# Patient Record
Sex: Female | Born: 1940 | Race: White | Hispanic: No | Marital: Married | Smoking: Never smoker
Health system: Southern US, Community
[De-identification: ages and names within clinical notes are randomized; demographics above are authoritative.]

## PROBLEM LIST (undated history)

## (undated) DIAGNOSIS — I8390 Asymptomatic varicose veins of unspecified lower extremity: Secondary | ICD-10-CM

## (undated) DIAGNOSIS — C44310 Basal cell carcinoma of skin of unspecified parts of face: Secondary | ICD-10-CM

## (undated) DIAGNOSIS — J329 Chronic sinusitis, unspecified: Secondary | ICD-10-CM

## (undated) DIAGNOSIS — F329 Major depressive disorder, single episode, unspecified: Secondary | ICD-10-CM

## (undated) DIAGNOSIS — F32A Depression, unspecified: Secondary | ICD-10-CM

## (undated) DIAGNOSIS — C44321 Squamous cell carcinoma of skin of nose: Secondary | ICD-10-CM

## (undated) DIAGNOSIS — E7800 Pure hypercholesterolemia, unspecified: Secondary | ICD-10-CM

## (undated) DIAGNOSIS — M810 Age-related osteoporosis without current pathological fracture: Secondary | ICD-10-CM

## (undated) HISTORY — PX: OTHER SURGICAL HISTORY: SHX169

## (undated) HISTORY — PX: COLONOSCOPY: SHX174

---

## 1998-08-19 ENCOUNTER — Other Ambulatory Visit: Admission: RE | Admit: 1998-08-19 | Discharge: 1998-08-19 | Payer: Self-pay | Admitting: Obstetrics & Gynecology

## 1999-08-20 ENCOUNTER — Encounter: Payer: Self-pay | Admitting: Internal Medicine

## 1999-08-20 ENCOUNTER — Encounter: Admission: RE | Admit: 1999-08-20 | Discharge: 1999-08-20 | Payer: Self-pay | Admitting: Internal Medicine

## 1999-09-02 ENCOUNTER — Other Ambulatory Visit: Admission: RE | Admit: 1999-09-02 | Discharge: 1999-09-02 | Payer: Self-pay | Admitting: Obstetrics and Gynecology

## 2000-09-13 ENCOUNTER — Encounter: Payer: Self-pay | Admitting: Family Medicine

## 2000-09-13 ENCOUNTER — Encounter: Admission: RE | Admit: 2000-09-13 | Discharge: 2000-09-13 | Payer: Self-pay | Admitting: Family Medicine

## 2000-12-20 ENCOUNTER — Other Ambulatory Visit: Admission: RE | Admit: 2000-12-20 | Discharge: 2000-12-20 | Payer: Self-pay | Admitting: Obstetrics and Gynecology

## 2001-10-26 ENCOUNTER — Encounter: Admission: RE | Admit: 2001-10-26 | Discharge: 2001-10-26 | Payer: Self-pay | Admitting: Family Medicine

## 2001-10-26 ENCOUNTER — Encounter: Payer: Self-pay | Admitting: Family Medicine

## 2002-04-04 ENCOUNTER — Other Ambulatory Visit: Admission: RE | Admit: 2002-04-04 | Discharge: 2002-04-04 | Payer: Self-pay | Admitting: Obstetrics and Gynecology

## 2003-01-02 ENCOUNTER — Encounter: Payer: Self-pay | Admitting: Family Medicine

## 2003-01-02 ENCOUNTER — Encounter: Admission: RE | Admit: 2003-01-02 | Discharge: 2003-01-02 | Payer: Self-pay | Admitting: Family Medicine

## 2003-10-17 ENCOUNTER — Other Ambulatory Visit: Admission: RE | Admit: 2003-10-17 | Discharge: 2003-10-17 | Payer: Self-pay | Admitting: Family Medicine

## 2003-11-08 ENCOUNTER — Encounter: Admission: RE | Admit: 2003-11-08 | Discharge: 2003-11-08 | Payer: Self-pay | Admitting: Family Medicine

## 2004-01-24 ENCOUNTER — Ambulatory Visit (HOSPITAL_COMMUNITY): Admission: RE | Admit: 2004-01-24 | Discharge: 2004-01-24 | Payer: Self-pay | Admitting: Gastroenterology

## 2004-01-24 ENCOUNTER — Encounter (INDEPENDENT_AMBULATORY_CARE_PROVIDER_SITE_OTHER): Payer: Self-pay | Admitting: *Deleted

## 2005-02-24 ENCOUNTER — Other Ambulatory Visit: Admission: RE | Admit: 2005-02-24 | Discharge: 2005-02-24 | Payer: Self-pay | Admitting: Family Medicine

## 2006-02-03 ENCOUNTER — Encounter: Admission: RE | Admit: 2006-02-03 | Discharge: 2006-02-03 | Payer: Self-pay | Admitting: Family Medicine

## 2007-02-11 ENCOUNTER — Encounter: Admission: RE | Admit: 2007-02-11 | Discharge: 2007-02-11 | Payer: Self-pay | Admitting: Family Medicine

## 2007-05-11 ENCOUNTER — Other Ambulatory Visit: Admission: RE | Admit: 2007-05-11 | Discharge: 2007-05-11 | Payer: Self-pay | Admitting: Family Medicine

## 2008-02-27 ENCOUNTER — Encounter: Admission: RE | Admit: 2008-02-27 | Discharge: 2008-02-27 | Payer: Self-pay | Admitting: Family Medicine

## 2008-06-15 DIAGNOSIS — C44321 Squamous cell carcinoma of skin of nose: Secondary | ICD-10-CM

## 2008-06-15 HISTORY — DX: Squamous cell carcinoma of skin of nose: C44.321

## 2009-11-29 ENCOUNTER — Other Ambulatory Visit: Admission: RE | Admit: 2009-11-29 | Discharge: 2009-11-29 | Payer: Self-pay | Admitting: Family Medicine

## 2010-01-24 ENCOUNTER — Encounter: Admission: RE | Admit: 2010-01-24 | Discharge: 2010-01-24 | Payer: Self-pay | Admitting: Family Medicine

## 2010-07-06 ENCOUNTER — Encounter: Payer: Self-pay | Admitting: Family Medicine

## 2010-10-31 NOTE — Op Note (Signed)
NAME:  Amanda Hanna, Amanda Hanna                      ACCOUNT NO.:  [id number]   MEDICAL RECORD NO.:  [id number]                   PATIENT TYPE:  AMB   LOCATION:  ENDO                                 FACILITY:  [facility]   PHYSICIAN:  Graylin Shiver, M.D.                DATE OF BIRTH:  09-02-1940   DATE OF PROCEDURE:  01/24/2004  DATE OF DISCHARGE:                                 OPERATIVE REPORT   PROCEDURE:  Colonoscopy with biopsy.   INDICATIONS FOR PROCEDURE:  Screening.   Informed consent was obtained after explanation of the risks of bleeding,  infection and perforation.   PREMEDICATION:  Fentanyl 75 mcg IV, Versed 7 mg IV.   DESCRIPTION OF PROCEDURE:  With the patient in the left lateral decubitus  position, a rectal exam was performed and no masses were felt. The Olympus  colonoscope was then inserted into the rectum and advanced around the colon  to the cecum. Cecal landmarks were identified. The cecum and ascending colon  were normal. The transverse colon was normal.  The descending colon and  sigmoid revealed diverticulosis.  In the sigmoid, there was a small 3 mm  polyp biopsied off with cold forceps. The rectum was normal. She tolerated  the procedure well without complications.   IMPRESSION:  1. Small sigmoid polyp, diagnosis code 211.3.  2. Diverticulosis of the left colon.   PLAN:  The pathology will be checked.                                               Graylin Shiver, M.D.    SFG/MEDQ  D:  01/24/2004  T:  01/24/2004  Job:  [id number]   cc:   Stacie Acres. White, M.D.  [address]  [place]  [place] [address]  Fax: [phone]

## 2011-03-09 ENCOUNTER — Other Ambulatory Visit: Payer: Self-pay | Admitting: Family Medicine

## 2011-03-09 DIAGNOSIS — Z1231 Encounter for screening mammogram for malignant neoplasm of breast: Secondary | ICD-10-CM

## 2011-03-20 ENCOUNTER — Ambulatory Visit: Payer: Self-pay

## 2011-03-25 ENCOUNTER — Ambulatory Visit
Admission: RE | Admit: 2011-03-25 | Discharge: 2011-03-25 | Disposition: A | Discharge status: Home / Self Care | Payer: Medicare Other | Source: Ambulatory Visit | Attending: Family Medicine | Admitting: Family Medicine

## 2011-03-25 DIAGNOSIS — Z1231 Encounter for screening mammogram for malignant neoplasm of breast: Secondary | ICD-10-CM

## 2012-03-10 ENCOUNTER — Other Ambulatory Visit: Payer: Self-pay | Admitting: Family Medicine

## 2012-03-10 DIAGNOSIS — Z1231 Encounter for screening mammogram for malignant neoplasm of breast: Secondary | ICD-10-CM

## 2012-03-25 ENCOUNTER — Ambulatory Visit
Admission: RE | Admit: 2012-03-25 | Discharge: 2012-03-25 | Disposition: A | Discharge status: Home / Self Care | Payer: Medicare Other | Source: Ambulatory Visit | Attending: Family Medicine | Admitting: Family Medicine

## 2012-03-25 DIAGNOSIS — Z1231 Encounter for screening mammogram for malignant neoplasm of breast: Secondary | ICD-10-CM

## 2013-04-04 ENCOUNTER — Other Ambulatory Visit: Payer: Self-pay

## 2013-04-04 DIAGNOSIS — Z1231 Encounter for screening mammogram for malignant neoplasm of breast: Secondary | ICD-10-CM

## 2013-05-03 ENCOUNTER — Ambulatory Visit
Admission: RE | Admit: 2013-05-03 | Discharge: 2013-05-03 | Disposition: A | Discharge status: Home / Self Care | Payer: Medicare Other | Source: Ambulatory Visit

## 2013-05-03 DIAGNOSIS — Z1231 Encounter for screening mammogram for malignant neoplasm of breast: Secondary | ICD-10-CM

## 2014-03-07 ENCOUNTER — Other Ambulatory Visit: Payer: Self-pay | Admitting: Gastroenterology

## 2014-04-12 ENCOUNTER — Other Ambulatory Visit: Payer: Self-pay

## 2014-04-12 DIAGNOSIS — Z1231 Encounter for screening mammogram for malignant neoplasm of breast: Secondary | ICD-10-CM

## 2014-05-04 ENCOUNTER — Ambulatory Visit
Admission: RE | Admit: 2014-05-04 | Discharge: 2014-05-04 | Disposition: A | Discharge status: Home / Self Care | Payer: Medicare Other | Source: Ambulatory Visit

## 2014-05-04 DIAGNOSIS — Z1231 Encounter for screening mammogram for malignant neoplasm of breast: Secondary | ICD-10-CM

## 2014-06-29 ENCOUNTER — Other Ambulatory Visit: Payer: Self-pay | Admitting: Family Medicine

## 2014-07-23 ENCOUNTER — Encounter (HOSPITAL_COMMUNITY): Payer: Self-pay

## 2014-07-23 ENCOUNTER — Ambulatory Visit (HOSPITAL_COMMUNITY)
Admission: RE | Admit: 2014-07-23 | Discharge: 2014-07-23 | Disposition: A | Discharge status: Home / Self Care | Payer: Medicare Other | Source: Ambulatory Visit | Attending: Family Medicine | Admitting: Family Medicine

## 2014-07-23 ENCOUNTER — Other Ambulatory Visit (HOSPITAL_COMMUNITY): Payer: Self-pay | Admitting: Family Medicine

## 2014-07-23 DIAGNOSIS — M810 Age-related osteoporosis without current pathological fracture: Secondary | ICD-10-CM | POA: Insufficient documentation

## 2014-07-23 HISTORY — DX: Squamous cell carcinoma of skin of nose: C44.321

## 2014-07-23 HISTORY — DX: Chronic sinusitis, unspecified: J32.9

## 2014-07-23 HISTORY — DX: Asymptomatic varicose veins of unspecified lower extremity: I83.90

## 2014-07-23 HISTORY — DX: Major depressive disorder, single episode, unspecified: F32.9

## 2014-07-23 HISTORY — DX: Basal cell carcinoma of skin of unspecified parts of face: C44.310

## 2014-07-23 HISTORY — DX: Depression, unspecified: F32.A

## 2014-07-23 HISTORY — DX: Pure hypercholesterolemia, unspecified: E78.00

## 2014-07-23 MED ORDER — ZOLEDRONIC ACID 5 MG/100ML IV SOLN
5.0000 mg | Freq: Once | INTRAVENOUS | Status: AC
  Administered 2014-07-23: 5 mg via INTRAVENOUS
  Filled 2014-07-23: qty 100

## 2014-07-23 MED ORDER — SODIUM CHLORIDE 0.9 % IV SOLN
Freq: Once | INTRAVENOUS | Status: AC
  Administered 2014-07-23: 250 mL via INTRAVENOUS

## 2014-07-23 NOTE — Discharge Instructions (Signed)
Drink  Fluids/water as tolerated over the next 72 hours Tylenol or ibuprofen OTC as directed Continue Calcium and Vit D as directed by your MD Contact Dr Dema Severin for any questions or concerns   RECLAST Zoledronic Acid injection (Paget's Disease, Osteoporosis) What is this medicine? ZOLEDRONIC ACID (ZOE le dron ik AS id) lowers the amount of calcium loss from bone. It is used to treat Paget's disease and osteoporosis in women. This medicine may be used for other purposes; ask your health care provider or pharmacist if you have questions. COMMON BRAND NAME(S): Reclast, Zometa What should I tell my health care provider before I take this medicine? They need to know if you have any of these conditions: -aspirin-sensitive asthma -cancer, especially if you are receiving medicines used to treat cancer -dental disease or wear dentures -infection -kidney disease -low levels of calcium in the blood -past surgery on the parathyroid gland or intestines -receiving corticosteroids like dexamethasone or prednisone -an unusual or allergic reaction to zoledronic acid, other medicines, foods, dyes, or preservatives -pregnant or trying to get pregnant -breast-feeding How should I use this medicine? This medicine is for infusion into a vein. It is given by a health care professional in a hospital or clinic setting. Talk to your pediatrician regarding the use of this medicine in children. This medicine is not approved for use in children. Overdosage: If you think you have taken too much of this medicine contact a poison control center or emergency room at once. NOTE: This medicine is only for you. Do not share this medicine with others. What if I miss a dose? It is important not to miss your dose. Call your doctor or health care professional if you are unable to keep an appointment. What may interact with this medicine? -certain antibiotics given by injection -NSAIDs, medicines for pain and inflammation,  like ibuprofen or naproxen -some diuretics like bumetanide, furosemide -teriparatide This list may not describe all possible interactions. Give your health care provider a list of all the medicines, herbs, non-prescription drugs, or dietary supplements you use. Also tell them if you smoke, drink alcohol, or use illegal drugs. Some items may interact with your medicine. What should I watch for while using this medicine? Visit your doctor or health care professional for regular checkups. It may be some time before you see the benefit from this medicine. Do not stop taking your medicine unless your doctor tells you to. Your doctor may order blood tests or other tests to see how you are doing. Women should inform their doctor if they wish to become pregnant or think they might be pregnant. There is a potential for serious side effects to an unborn child. Talk to your health care professional or pharmacist for more information. You should make sure that you get enough calcium and vitamin D while you are taking this medicine. Discuss the foods you eat and the vitamins you take with your health care professional. Some people who take this medicine have severe bone, joint, and/or muscle pain. This medicine may also increase your risk for jaw problems or a broken thigh bone. Tell your doctor right away if you have severe pain in your jaw, bones, joints, or muscles. Tell your doctor if you have any pain that does not go away or that gets worse. Tell your dentist and dental surgeon that you are taking this medicine. You should not have major dental surgery while on this medicine. See your dentist to have a dental exam and fix  any dental problems before starting this medicine. Take good care of your teeth while on this medicine. Make sure you see your dentist for regular follow-up appointments. What side effects may I notice from receiving this medicine? Side effects that you should report to your doctor or health  care professional as soon as possible: -allergic reactions like skin rash, itching or hives, swelling of the face, lips, or tongue -anxiety, confusion, or depression -breathing problems -changes in vision -eye pain -feeling faint or lightheaded, falls -jaw pain, especially after dental work -mouth sores -muscle cramps, stiffness, or weakness -trouble passing urine or change in the amount of urine Side effects that usually do not require medical attention (report to your doctor or health care professional if they continue or are bothersome): -bone, joint, or muscle pain -constipation -diarrhea -fever -hair loss -irritation at site where injected -loss of appetite -nausea, vomiting -stomach upset -trouble sleeping -trouble swallowing -weak or tired This list may not describe all possible side effects. Call your doctor for medical advice about side effects. You may report side effects to FDA at 1-800-FDA-1088. Where should I keep my medicine? This drug is given in a hospital or clinic and will not be stored at home. NOTE: This sheet is a summary. It may not cover all possible information. If you have questions about this medicine, talk to your doctor, pharmacist, or health care provider.  2015, Elsevier/Gold Standard. (2012-11-14 10:03:48) Osteoporosis Throughout your life, your body breaks down old bone and replaces it with new bone. As you get older, your body does not replace bone as quickly as it breaks it down. By the age of 4 years, most people begin to gradually lose bone because of the imbalance between bone loss and replacement. Some people lose more bone than others. Bone loss beyond a specified normal degree is considered osteoporosis.  Osteoporosis affects the strength and durability of your bones. The inside of the ends of your bones and your flat bones, like the bones of your pelvis, look like honeycomb, filled with tiny open spaces. As bone loss occurs, your bones become  less dense. This means that the open spaces inside your bones become bigger and the walls between these spaces become thinner. This makes your bones weaker. Bones of a person with osteoporosis can become so weak that they can break (fracture) during minor accidents, such as a simple fall. CAUSES  The following factors have been associated with the development of osteoporosis:  Smoking.  Drinking more than 2 alcoholic drinks several days per week.  Long-term use of certain medicines:  Corticosteroids.  Chemotherapy medicines.  Thyroid medicines.  Antiepileptic medicines.  Gonadal hormone suppression medicine.  Immunosuppression medicine.  Being underweight.  Lack of physical activity.  Lack of exposure to the sun. This can lead to vitamin D deficiency.  Certain medical conditions:  Certain inflammatory bowel diseases, such as Crohn disease and ulcerative colitis.  Diabetes.  Hyperthyroidism.  Hyperparathyroidism. RISK FACTORS Anyone can develop osteoporosis. However, the following factors can increase your risk of developing osteoporosis:  Gender--Women are at higher risk than men.  Age--Being older than 50 years increases your risk.  Ethnicity--White and Asian people have an increased risk.  Weight --Being extremely underweight can increase your risk of osteoporosis.  Family history of osteoporosis--Having a family member who has developed osteoporosis can increase your risk. SYMPTOMS  Usually, people with osteoporosis have no symptoms.  DIAGNOSIS  Signs during a physical exam that may prompt your caregiver to suspect osteoporosis  include:  Decreased height. This is usually caused by the compression of the bones that form your spine (vertebrae) because they have weakened and become fractured.  A curving or rounding of the upper back (kyphosis). To confirm signs of osteoporosis, your caregiver may request a procedure that uses 2 low-dose X-ray beams with  different levels of energy to measure your bone mineral density (dual-energy X-ray absorptiometry [DXA]). Also, your caregiver may check your level of vitamin D. TREATMENT  The goal of osteoporosis treatment is to strengthen bones in order to decrease the risk of bone fractures. There are different types of medicines available to help achieve this goal. Some of these medicines work by slowing the processes of bone loss. Some medicines work by increasing bone density. Treatment also involves making sure that your levels of calcium and vitamin D are adequate. PREVENTION  There are things you can do to help prevent osteoporosis. Adequate intake of calcium and vitamin D can help you achieve optimal bone mineral density. Regular exercise can also help, especially resistance and weight-bearing activities. If you smoke, quitting smoking is an important part of osteoporosis prevention. MAKE SURE YOU:  Understand these instructions.  Will watch your condition.  Will get help right away if you are not doing well or get worse. FOR MORE INFORMATION www.osteo.org and EquipmentWeekly.com.ee Document Released: 03/11/2005 Document Revised: 09/26/2012 Document Reviewed: 05/16/2011 [facility] Patient Information 2015 [place], [place]. This information is not intended to replace advice given to you by your health care provider. Make sure you discuss any questions you have with your health care provider.

## 2014-10-18 ENCOUNTER — Other Ambulatory Visit (HOSPITAL_COMMUNITY): Payer: Self-pay | Admitting: Dermatology

## 2015-04-01 ENCOUNTER — Other Ambulatory Visit: Payer: Self-pay

## 2015-04-01 DIAGNOSIS — Z1231 Encounter for screening mammogram for malignant neoplasm of breast: Secondary | ICD-10-CM

## 2015-04-09 ENCOUNTER — Emergency Department (HOSPITAL_COMMUNITY)
Admission: EM | Admit: 2015-04-09 | Discharge: 2015-04-09 | Disposition: A | Discharge status: Home / Self Care | Payer: Medicare Other | Attending: Emergency Medicine | Admitting: Emergency Medicine

## 2015-04-09 ENCOUNTER — Encounter (HOSPITAL_COMMUNITY): Payer: Self-pay | Admitting: Emergency Medicine

## 2015-04-09 DIAGNOSIS — F329 Major depressive disorder, single episode, unspecified: Secondary | ICD-10-CM | POA: Diagnosis not present

## 2015-04-09 DIAGNOSIS — S6991XA Unspecified injury of right wrist, hand and finger(s), initial encounter: Secondary | ICD-10-CM | POA: Diagnosis present

## 2015-04-09 DIAGNOSIS — S61319A Laceration without foreign body of unspecified finger with damage to nail, initial encounter: Secondary | ICD-10-CM

## 2015-04-09 DIAGNOSIS — E7800 Pure hypercholesterolemia, unspecified: Secondary | ICD-10-CM | POA: Diagnosis not present

## 2015-04-09 DIAGNOSIS — Y9389 Activity, other specified: Secondary | ICD-10-CM | POA: Diagnosis not present

## 2015-04-09 DIAGNOSIS — Z79899 Other long term (current) drug therapy: Secondary | ICD-10-CM | POA: Diagnosis not present

## 2015-04-09 DIAGNOSIS — Z7951 Long term (current) use of inhaled steroids: Secondary | ICD-10-CM | POA: Insufficient documentation

## 2015-04-09 DIAGNOSIS — Z85828 Personal history of other malignant neoplasm of skin: Secondary | ICD-10-CM | POA: Diagnosis not present

## 2015-04-09 DIAGNOSIS — Z8679 Personal history of other diseases of the circulatory system: Secondary | ICD-10-CM | POA: Diagnosis not present

## 2015-04-09 DIAGNOSIS — W260XXA Contact with knife, initial encounter: Secondary | ICD-10-CM | POA: Diagnosis not present

## 2015-04-09 DIAGNOSIS — Z8709 Personal history of other diseases of the respiratory system: Secondary | ICD-10-CM | POA: Insufficient documentation

## 2015-04-09 DIAGNOSIS — Y9289 Other specified places as the place of occurrence of the external cause: Secondary | ICD-10-CM | POA: Insufficient documentation

## 2015-04-09 DIAGNOSIS — Y998 Other external cause status: Secondary | ICD-10-CM | POA: Diagnosis not present

## 2015-04-09 DIAGNOSIS — S61312A Laceration without foreign body of right middle finger with damage to nail, initial encounter: Secondary | ICD-10-CM | POA: Insufficient documentation

## 2015-04-09 NOTE — Discharge Instructions (Signed)
1. Medications: usual home medications 2. Treatment: rest, drink plenty of fluids, change dressing daily, keep dressing clean, dry, and intact. 3. Follow Up: please followup with your primary doctor in for discussion of your diagnoses and further evaluation after today's visit; please return to the ER for signs of infection (heat, redness, warmth, discharge)   Laceration Care, Adult A laceration is a cut that goes through all layers of the skin. The cut also goes into the tissue that is right under the skin. Some cuts heal on their own. Others need to be closed with stitches (sutures), staples, skin adhesive strips, or wound glue. Taking care of your cut lowers your risk of infection and helps your cut to heal better. HOW TO TAKE CARE OF YOUR CUT For stitches or staples:  Keep the wound clean and dry.  If you were given a bandage (dressing), you should change it at least one time per day or as told by your doctor. You should also change it if it gets wet or dirty.  Keep the wound completely dry for the first 24 hours or as told by your doctor. After that time, you may take a shower or a bath. However, make sure that the wound is not soaked in water until after the stitches or staples have been removed.  Clean the wound one time each day or as told by your doctor:  Wash the wound with soap and water.  Rinse the wound with water until all of the soap comes off.  Pat the wound dry with a clean towel. Do not rub the wound.  After you clean the wound, put a thin layer of antibiotic ointment on it as told by your doctor. This ointment:  Helps to prevent infection.  Keeps the bandage from sticking to the wound.  Have your stitches or staples removed as told by your doctor. If your doctor used skin adhesive strips:   Keep the wound clean and dry.  If you were given a bandage, you should change it at least one time per day or as told by your doctor. You should also change it if it gets  dirty or wet.  Do not get the skin adhesive strips wet. You can take a shower or a bath, but be careful to keep the wound dry.  If the wound gets wet, pat it dry with a clean towel. Do not rub the wound.  Skin adhesive strips fall off on their own. You can trim the strips as the wound heals. Do not remove any strips that are still stuck to the wound. They will fall off after a while. If your doctor used wound glue:  Try to keep your wound dry, but you may briefly wet it in the shower or bath. Do not soak the wound in water, such as by swimming.  After you take a shower or a bath, gently pat the wound dry with a clean towel. Do not rub the wound.  Do not do any activities that will make you really sweaty until the skin glue has fallen off on its own.  Do not apply liquid, cream, or ointment medicine to your wound while the skin glue is still on.  If you were given a bandage, you should change it at least one time per day or as told by your doctor. You should also change it if it gets dirty or wet.  If a bandage is placed over the wound, do not let the tape for  the bandage touch the skin glue.  Do not pick at the glue. The skin glue usually stays on for 5-10 days. Then, it falls off of the skin. General Instructions  To help prevent scarring, make sure to cover your wound with sunscreen whenever you are outside after stitches are removed, after adhesive strips are removed, or when wound glue stays in place and the wound is healed. Make sure to wear a sunscreen of at least 30 SPF.  Take over-the-counter and prescription medicines only as told by your doctor.  If you were given antibiotic medicine or ointment, take or apply it as told by your doctor. Do not stop using the antibiotic even if your wound is getting better.  Do not scratch or pick at the wound.  Keep all follow-up visits as told by your doctor. This is important.  Check your wound every day for signs of infection. Watch  for:  Redness, swelling, or pain.  Fluid, blood, or pus.  Raise (elevate) the injured area above the level of your heart while you are sitting or lying down, if possible. GET HELP IF:  You got a tetanus shot and you have any of these problems at the injection site:  Swelling.  Very bad pain.  Redness.  Bleeding.  You have a fever.  A wound that was closed breaks open.  You notice a bad smell coming from your wound or your bandage.  You notice something coming out of the wound, such as wood or glass.  Medicine does not help your pain.  You have more redness, swelling, or pain at the site of your wound.  You have fluid, blood, or pus coming from your wound.  You notice a change in the color of your skin near your wound.  You need to change the bandage often because fluid, blood, or pus is coming from the wound.  You start to have a new rash.  You start to have numbness around the wound. GET HELP RIGHT AWAY IF:  You have very bad swelling around the wound.  Your pain suddenly gets worse and is very bad.  You notice painful lumps near the wound or on skin that is anywhere on your body.  You have a red streak going away from your wound.  The wound is on your hand or foot and you cannot move a finger or toe like you usually can.  The wound is on your hand or foot and you notice that your fingers or toes look pale or bluish.   This information is not intended to replace advice given to you by your health care provider. Make sure you discuss any questions you have with your health care provider.   Document Released: 11/18/2007 Document Revised: 10/16/2014 Document Reviewed: 05/28/2014 Elsevier Interactive Patient Education [employer].

## 2015-04-09 NOTE — ED Notes (Signed)
Pt was making potato soup and cut her finger with potato slicer.

## 2015-04-09 NOTE — ED Provider Notes (Signed)
CSN: [id number]     Arrival date & time 04/09/15  1244 History   First MD Initiated Contact with Patient 04/09/15 1249     Chief Complaint  Patient presents with  . Extremity Laceration    right third finger     HPI   Amanda Hanna is a 74 y.o. female with a PMH of HLD, depression who presents to the ED with right third fingernail laceration. She states she was peeling potatoes and accidentally cut her fingernail. She reports she was able to control the bleeding by applying pressure. She states she has mild pain over the site of her laceration. She denies weakness, numbness, paresthesia. She states her tetanus has been updated within the past 5 years.   Past Medical History  Diagnosis Date  . Depression   . Sinusitis   . Facial basal cell cancer   . 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 Varicose vein   . Hypercholesteremia    Past Surgical History  Procedure Laterality Date  . Removal squamous cell cancer nose    . Vaginal delivery    . Deviated septum  repair    . Eyelid surgery      bilateral 2008  . Basal cell carcinoma from neck    . Colonoscopy    . Osteoporosis     No family history on file. Social History  Substance Use Topics  . Smoking status: Never Smoker   . Smokeless tobacco: None  . Alcohol Use: No   OB History    No data available      Review of Systems  Skin: Positive for wound.  Neurological: Negative for weakness and numbness.      Allergies  Boniva; Septra; and Sulfa antibiotics  Home Medications   Prior to Admission medications   Medication Sig Start Date End Date Taking? Authorizing Provider  atorvastatin (LIPITOR) 20 MG tablet Take 20 mg by mouth daily.    Historical Provider, MD  calcium carbonate (TUMS - DOSED IN MG ELEMENTAL CALCIUM) 500 MG chewable tablet Chew 1 tablet by mouth daily.    Historical Provider, MD  escitalopram (LEXAPRO) 10 MG tablet Take 10 mg by mouth daily.    Historical Provider, MD  fluticasone  (FLONASE) 50 MCG/ACT nasal spray Place into both nostrils daily.    Historical Provider, MD  triamterene-hydrochlorothiazide (DYAZIDE) 37.5-25 MG per capsule Take 1 capsule by mouth daily.    Historical Provider, MD    BP 143/86 mmHg  Pulse 93  Temp(Src) 98.2 F (36.8 C) (Oral)  Resp 20  Ht 5\' 1"  (1.549 m)  Wt 192 lb (87.091 kg)  BMI 36.30 kg/m2  SpO2 99% Physical Exam  Constitutional: She is oriented to person, place, and time. She appears well-developed and well-nourished. No distress.  HENT:  Head: Normocephalic and atraumatic.  Right Ear: External ear normal.  Left Ear: External ear normal.  Nose: Nose normal.  Mouth/Throat: Oropharynx is clear and moist.  Eyes: Conjunctivae and EOM are normal. Pupils are equal, round, and reactive to light. Right eye exhibits no discharge. Left eye exhibits no discharge. No scleral icterus.  Neck: Normal range of motion. Neck supple.  Cardiovascular: Normal rate and regular rhythm.   Pulmonary/Chest: Effort normal and breath sounds normal. No respiratory distress.  Musculoskeletal: Normal range of motion. She exhibits tenderness. She exhibits no edema.  Mild tenderness to palpation over laceration.  Neurological: She is alert and oriented to person, place, and time. She has normal  strength. No sensory deficit.  Skin: Skin is warm and dry. She is not diaphoretic.  Small superficial laceration to nail of right third finger.  Psychiatric: She has a normal mood and affect. Her behavior is normal.  Nursing note and vitals reviewed.   ED Course  Procedures (including critical care time)  Labs Review Labs Reviewed - No data to display  Imaging Review No results found.     EKG Interpretation None      MDM   Final diagnoses:  Laceration of fingernail, initial encounter    74 year old female presents with right third fingernail laceration, which occurred approximately 30 minutes prior to arrival. She reports mild pain. She denies  numbness, weakness, paresthesia. She states her tetanus is up-to-date. Patient is afebrile. Vital signs stable. Small superficial laceration to nail of right third finger with mild tenderness to palpation, hemostatic. Full range of motion of right hand. Strength and sensation intact. Distal pulses intact.   No repairable laceration. Wound cleaned and dressed in the ED. Patient is stable for discharge at this time. Return precautions discussed including signs and symptoms of infection. Patient to follow up with PCP as needed. Patient verbalizes her understanding and is in agreement with plan.  BP 143/86 mmHg  Pulse 93  Temp(Src) 98.2 F (36.8 C) (Oral)  Resp 20  Ht 5\' 1"  (1.549 m)  Wt 192 lb (87.091 kg)  BMI 36.30 kg/m2  SpO2 99%     Marella Chimes, PA-C 04/09/15 1535  Virgel Manifold, MD 04/09/15 1550

## 2015-04-09 NOTE — ED Notes (Signed)
Patient was cutting potatoes with a kitchen knife earlier today and cut the tip of her right third finger.  Patient has avulsion lac to right third finger with distal tip of nail missing.  Bleeding controlled.  Full ROM - flexion and extension, radial nerve intact.

## 2015-05-06 ENCOUNTER — Ambulatory Visit
Admission: RE | Admit: 2015-05-06 | Discharge: 2015-05-06 | Disposition: A | Discharge status: Home / Self Care | Payer: Medicare Other | Source: Ambulatory Visit

## 2015-05-06 DIAGNOSIS — Z1231 Encounter for screening mammogram for malignant neoplasm of breast: Secondary | ICD-10-CM

## 2015-07-09 ENCOUNTER — Other Ambulatory Visit: Payer: Self-pay | Admitting: Family Medicine

## 2015-07-29 ENCOUNTER — Encounter (HOSPITAL_COMMUNITY): Payer: Self-pay

## 2015-07-29 ENCOUNTER — Other Ambulatory Visit (HOSPITAL_COMMUNITY): Payer: Self-pay | Admitting: Family Medicine

## 2015-07-29 ENCOUNTER — Ambulatory Visit (HOSPITAL_COMMUNITY)
Admission: RE | Admit: 2015-07-29 | Discharge: 2015-07-29 | Disposition: A | Discharge status: Home / Self Care | Payer: Medicare Other | Source: Ambulatory Visit | Attending: Family Medicine | Admitting: Family Medicine

## 2015-07-29 DIAGNOSIS — M810 Age-related osteoporosis without current pathological fracture: Secondary | ICD-10-CM | POA: Insufficient documentation

## 2015-07-29 HISTORY — DX: Age-related osteoporosis without current pathological fracture: M81.0

## 2015-07-29 MED ORDER — SODIUM CHLORIDE 0.9 % IV SOLN
Freq: Once | INTRAVENOUS | Status: AC
  Administered 2015-07-29: 12:00:00 via INTRAVENOUS

## 2015-07-29 MED ORDER — ZOLEDRONIC ACID 5 MG/100ML IV SOLN
5.0000 mg | Freq: Once | INTRAVENOUS | Status: AC
  Administered 2015-07-29: 5 mg via INTRAVENOUS
  Filled 2015-07-29: qty 100

## 2015-07-29 NOTE — Discharge Instructions (Signed)
Drink  Fluids/water as tolerated over the next 72 hours °Tylenol or ibuprofen OTC as directed °Continue Calcium and Vit D as directed by your MD ° ° °RECLAST °Zoledronic Acid injection (Paget's Disease, Osteoporosis) °What is this medicine? °ZOLEDRONIC ACID (ZOE le dron ik AS id) lowers the amount of calcium loss from bone. It is used to treat Paget's disease and osteoporosis in women. °This medicine may be used for other purposes; ask your health care provider or pharmacist if you have questions. °What should I tell my health care provider before I take this medicine? °They need to know if you have any of these conditions: °-aspirin-sensitive asthma °-cancer, especially if you are receiving medicines used to treat cancer °-dental disease or wear dentures °-infection °-kidney disease °-low levels of calcium in the blood °-past surgery on the parathyroid gland or intestines °-receiving corticosteroids like dexamethasone or prednisone °-an unusual or allergic reaction to zoledronic acid, other medicines, foods, dyes, or preservatives °-pregnant or trying to get pregnant °-breast-feeding °How should I use this medicine? °This medicine is for infusion into a vein. It is given by a health care professional in a hospital or clinic setting. °Talk to your pediatrician regarding the use of this medicine in children. This medicine is not approved for use in children. °Overdosage: If you think you have taken too much of this medicine contact a poison control center or emergency room at once. °NOTE: This medicine is only for you. Do not share this medicine with others. °What if I miss a dose? °It is important not to miss your dose. Call your doctor or health care professional if you are unable to keep an appointment. °What may interact with this medicine? °-certain antibiotics given by injection °-NSAIDs, medicines for pain and inflammation, like ibuprofen or naproxen °-some diuretics like bumetanide,  furosemide °-teriparatide °This list may not describe all possible interactions. Give your health care provider a list of all the medicines, herbs, non-prescription drugs, or dietary supplements you use. Also tell them if you smoke, drink alcohol, or use illegal drugs. Some items may interact with your medicine. °What should I watch for while using this medicine? °Visit your doctor or health care professional for regular checkups. It may be some time before you see the benefit from this medicine. Do not stop taking your medicine unless your doctor tells you to. Your doctor may order blood tests or other tests to see how you are doing. °Women should inform their doctor if they wish to become pregnant or think they might be pregnant. There is a potential for serious side effects to an unborn child. Talk to your health care professional or pharmacist for more information. °You should make sure that you get enough calcium and vitamin D while you are taking this medicine. Discuss the foods you eat and the vitamins you take with your health care professional. °Some people who take this medicine have severe bone, joint, and/or muscle pain. This medicine may also increase your risk for jaw problems or a broken thigh bone. Tell your doctor right away if you have severe pain in your jaw, bones, joints, or muscles. Tell your doctor if you have any pain that does not go away or that gets worse. °Tell your dentist and dental surgeon that you are taking this medicine. You should not have major dental surgery while on this medicine. See your dentist to have a dental exam and fix any dental problems before starting this medicine. Take good care of your teeth   while on this medicine. Make sure you see your dentist for regular follow-up appointments. °What side effects may I notice from receiving this medicine? °Side effects that you should report to your doctor or health care professional as soon as possible: °-allergic reactions  like skin rash, itching or hives, swelling of the face, lips, or tongue °-anxiety, confusion, or depression °-breathing problems °-changes in vision °-eye pain °-feeling faint or lightheaded, falls °-jaw pain, especially after dental work °-mouth sores °-muscle cramps, stiffness, or weakness °-redness, blistering, peeling or loosening of the skin, including inside the mouth °-trouble passing urine or change in the amount of urine °Side effects that usually do not require medical attention (report to your doctor or health care professional if they continue or are bothersome): °-bone, joint, or muscle pain °-constipation °-diarrhea °-fever °-hair loss °-irritation at site where injected °-loss of appetite °-nausea, vomiting °-stomach upset °-trouble sleeping °-trouble swallowing °-weak or tired °This list may not describe all possible side effects. Call your doctor for medical advice about side effects. You may report side effects to FDA at 1-800-FDA-1088. °Where should I keep my medicine? °This drug is given in a hospital or clinic and will not be stored at home. °NOTE: This sheet is a summary. It may not cover all possible information. If you have questions about this medicine, talk to your doctor, pharmacist, or health care provider. °  °© 2016, [employer]. (2013-10-28 14:19:57) °Osteoporosis °Osteoporosis is the thinning and loss of density in the bones. Osteoporosis makes the bones more brittle, fragile, and likely to break (fracture). Over time, osteoporosis can cause the bones to become so weak that they fracture after a simple fall. The bones most likely to fracture are the bones in the hip, wrist, and spine. °CAUSES  °The exact cause is not known. °RISK FACTORS °Anyone can develop osteoporosis. You may be at greater risk if you have a family history of the condition or have poor nutrition. You may also have a higher risk if you are:  °· Female.   °· 50 years old or older. °· A smoker. °· Not  physically active.   °· White or Asian. °· Slender. °SIGNS AND SYMPTOMS  °A fracture might be the first sign of the disease, especially if it results from a fall or injury that would not usually cause a bone to break. Other signs and symptoms include:  °· Low back and neck pain. °· Stooped posture. °· Height loss. °DIAGNOSIS  °To make a diagnosis, your health care provider may: °· Take a medical history. °· Perform a physical exam. °· Order tests, such as: °¨ A bone mineral density test. °¨ A dual-energy X-ray absorptiometry test. °TREATMENT  °The goal of osteoporosis treatment is to strengthen your bones to reduce your risk of a fracture. Treatment may involve: °· Making lifestyle changes, such as: °¨ Eating a diet rich in calcium. °¨ Doing weight-bearing and muscle-strengthening exercises. °¨ Stopping tobacco use. °¨ Limiting alcohol intake. °· Taking medicine to slow the process of bone loss or to increase bone density. °· Monitoring your levels of calcium and vitamin D. °HOME CARE INSTRUCTIONS °· Include calcium and vitamin D in your diet. Calcium is important for bone health, and vitamin D helps the body absorb calcium. °· Perform weight-bearing and muscle-strengthening exercises as directed by your health care provider. °· Do not use any tobacco products, including cigarettes, chewing tobacco, and electronic cigarettes. If you need help quitting, ask your health care provider. °· Limit your alcohol intake. °·   Take medicines only as directed by your health care provider. °· Keep all follow-up visits as directed by your health care provider. This is important. °· Take precautions at home to lower your risk of falling, such as: °¨ Keeping rooms well lit and clutter free. °¨ Installing safety rails on stairs. °¨ Using rubber mats in the bathroom and other areas that are often wet or slippery. °SEEK IMMEDIATE MEDICAL CARE IF:  °You fall or injure yourself.  °  °This information is not intended to replace advice  given to you by your health care provider. Make sure you discuss any questions you have with your health care provider. °  °Document Released: 03/11/2005 Document Revised: 06/22/2014 Document Reviewed: 11/09/2013 °Elsevier Interactive Patient Education ©2016 [employer]. ° ° °

## 2016-06-12 ENCOUNTER — Encounter (HOSPITAL_COMMUNITY): Payer: Self-pay | Admitting: Emergency Medicine

## 2016-06-12 ENCOUNTER — Ambulatory Visit (HOSPITAL_COMMUNITY)
Admission: EM | Admit: 2016-06-12 | Discharge: 2016-06-12 | Disposition: A | Discharge status: Home / Self Care | Payer: Medicare Other | Attending: Family Medicine | Admitting: Family Medicine

## 2016-06-12 DIAGNOSIS — R05 Cough: Secondary | ICD-10-CM | POA: Diagnosis not present

## 2016-06-12 DIAGNOSIS — J40 Bronchitis, not specified as acute or chronic: Secondary | ICD-10-CM

## 2016-06-12 DIAGNOSIS — R059 Cough, unspecified: Secondary | ICD-10-CM

## 2016-06-12 MED ORDER — BENZONATATE 100 MG PO CAPS: 200.0000 mg | ORAL_CAPSULE | Freq: Three times a day (TID) | ORAL | 0 refills | Status: DC | PRN

## 2016-06-12 MED ORDER — PREDNISONE 10 MG PO TABS: 20.0000 mg | ORAL_TABLET | Freq: Every day | ORAL | 0 refills | Status: DC

## 2016-06-12 MED ORDER — AZITHROMYCIN 250 MG PO TABS: 250.0000 mg | ORAL_TABLET | Freq: Every day | ORAL | 0 refills | Status: DC

## 2016-06-12 NOTE — ED Provider Notes (Signed)
CSN: [id number]     Arrival date & time 06/12/16  1109 History   First MD Initiated Contact with Patient 06/12/16 1148     Chief Complaint  Patient presents with  . Sinusitis   (Consider location/radiation/quality/duration/timing/severity/associated sxs/prior Treatment) Patient has uri and c/o headache, congestion, sinus pressure, and productive cough for about 2 weeks.   The history is provided by the patient.  Sinusitis  Pain details:    Location:  Frontal   Quality:  Aching   Severity:  Moderate   Duration:  2 weeks   Timing:  Constant Duration:  2 weeks Progression:  Worsening Chronicity:  New Context: recent URI   Relieved by:  Nothing Worsened by:  Nothing Ineffective treatments:  None tried Associated symptoms: headaches and sore throat     Past Medical History:  Diagnosis Date  . Depression   . Facial basal cell cancer   . Hypercholesteremia   . Osteoporosis   . Sinusitis   . 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 Varicose vein    Past Surgical History:  Procedure Laterality Date  . basal cell carcinoma from neck    . COLONOSCOPY    . deviated septum  repair    . eyelid surgery     bilateral 2008  . osteoporosis    . removal squamous cell cancer nose    . VAGINAL DELIVERY     History reviewed. No pertinent family history. Social History  Substance Use Topics  . Smoking status: Never Smoker  . Smokeless tobacco: Never Used  . Alcohol use No   OB History    No data available     Review of Systems  Constitutional: Negative.   HENT: Positive for sinus pain and sore throat.   Eyes: Negative.   Respiratory: Negative.   Cardiovascular: Negative.  Negative for leg swelling.  Gastrointestinal: Negative.   Endocrine: Negative.   Genitourinary: Negative.   Musculoskeletal: Negative.   Allergic/Immunologic: Negative.   Neurological: Positive for headaches.  Hematological: Negative.   Psychiatric/Behavioral: Negative.     Allergies   Boniva [ibandronic acid]; Septra [sulfamethoxazole-trimethoprim]; and Sulfa antibiotics  Home Medications   Prior to Admission medications   Medication Sig Start Date End Date Taking? Authorizing Provider  calcium carbonate (TUMS - DOSED IN MG ELEMENTAL CALCIUM) 500 MG chewable tablet Chew 1 tablet by mouth daily.   Yes Historical Provider, MD  cholecalciferol (VITAMIN D) 1000 units tablet Take 1,000 Units by mouth daily.   Yes Historical Provider, MD  fluticasone (FLONASE) 50 MCG/ACT nasal spray Place into both nostrils daily.   Yes Historical Provider, MD  triamterene-hydrochlorothiazide (DYAZIDE) 37.5-25 MG per capsule Take 1 capsule by mouth daily.   Yes Historical Provider, MD  atorvastatin (LIPITOR) 20 MG tablet Take 20 mg by mouth daily.    Historical Provider, MD  azithromycin (ZITHROMAX) 250 MG tablet Take 1 tablet (250 mg total) by mouth daily. Take first 2 tablets together, then 1 every day until finished. 06/12/16   Lysbeth Penner, FNP  benzonatate (TESSALON) 100 MG capsule Take 2 capsules (200 mg total) by mouth 3 (three) times daily as needed for cough. 06/12/16   Lysbeth Penner, FNP  escitalopram (LEXAPRO) 10 MG tablet Take 10 mg by mouth daily.    Historical Provider, MD  Multiple Vitamins-Minerals (MULTIVITAMIN WITH MINERALS) tablet Take 1 tablet by mouth daily.    Historical Provider, MD  predniSONE (DELTASONE) 10 MG tablet Take 2 tablets (20 mg total) by  mouth daily. 06/12/16   Lysbeth Penner, FNP   Meds Ordered and Administered this Visit  Medications - No data to display  BP 158/87 (BP Location: Left Arm)   Pulse 75   Temp 98.2 F (36.8 C) (Oral)   SpO2 98%  No data found.   Physical Exam  Constitutional: She appears well-developed and well-nourished.  HENT:  Head: Normocephalic.  Right Ear: External ear normal.  Left Ear: External ear normal.  Mouth/Throat: Oropharynx is clear and moist.  Eyes: Conjunctivae and EOM are normal. Pupils are equal, round,  and reactive to light.  Neck: Normal range of motion. Neck supple.  Cardiovascular: Normal rate, regular rhythm and normal heart sounds.   Pulmonary/Chest: Effort normal and breath sounds normal.  Abdominal: Soft. Bowel sounds are normal.  Nursing note and vitals reviewed.   Urgent Care Course   Clinical Course     Procedures (including critical care time)  Labs Review Labs Reviewed - No data to display  Imaging Review No results found.   Visual Acuity Review  Right Eye Distance:   Left Eye Distance:   Bilateral Distance:    Right Eye Near:   Left Eye Near:    Bilateral Near:         MDM   1. Bronchitis   2. Cough    Zpak Prednisone 10mg  2 po qd x 4 days #8 Tessalon perles 100mg  2 po tid prn #30  Push po fluids, rest, tylenol and motrin otc prn as directed for fever, arthralgias, and myalgias.  Follow up prn if sx's continue or persist.   Lysbeth Penner, FNP 06/12/16 1315

## 2016-06-12 NOTE — ED Triage Notes (Signed)
Pt has had sinus pressure and congestion with a headache for about 4 weeks.  She also reports a productive for about two weeks.  Pt denies any fever.

## 2016-06-24 ENCOUNTER — Other Ambulatory Visit: Payer: Self-pay | Admitting: Family Medicine

## 2016-06-24 DIAGNOSIS — Z1231 Encounter for screening mammogram for malignant neoplasm of breast: Secondary | ICD-10-CM

## 2016-07-08 ENCOUNTER — Ambulatory Visit
Admission: RE | Admit: 2016-07-08 | Discharge: 2016-07-08 | Disposition: A | Discharge status: Home / Self Care | Payer: Medicare Other | Source: Ambulatory Visit | Attending: Family Medicine | Admitting: Family Medicine

## 2016-07-08 ENCOUNTER — Other Ambulatory Visit: Payer: Self-pay | Admitting: Family Medicine

## 2016-07-08 DIAGNOSIS — M7918 Myalgia, other site: Secondary | ICD-10-CM

## 2016-07-08 DIAGNOSIS — M25551 Pain in right hip: Secondary | ICD-10-CM

## 2016-08-04 ENCOUNTER — Ambulatory Visit
Admission: RE | Admit: 2016-08-04 | Discharge: 2016-08-04 | Disposition: A | Discharge status: Home / Self Care | Payer: Medicare Other | Source: Ambulatory Visit | Attending: Family Medicine | Admitting: Family Medicine

## 2016-08-04 DIAGNOSIS — Z1231 Encounter for screening mammogram for malignant neoplasm of breast: Secondary | ICD-10-CM

## 2016-08-07 ENCOUNTER — Encounter (HOSPITAL_COMMUNITY): Payer: Self-pay

## 2016-08-07 ENCOUNTER — Ambulatory Visit (HOSPITAL_COMMUNITY)
Admission: RE | Admit: 2016-08-07 | Discharge: 2016-08-07 | Disposition: A | Discharge status: Home / Self Care | Payer: Medicare Other | Source: Ambulatory Visit | Attending: Family Medicine | Admitting: Family Medicine

## 2016-08-07 DIAGNOSIS — M810 Age-related osteoporosis without current pathological fracture: Secondary | ICD-10-CM | POA: Diagnosis not present

## 2016-08-07 MED ORDER — SODIUM CHLORIDE 0.9 % IV SOLN
Freq: Once | INTRAVENOUS | Status: AC
  Administered 2016-08-07: 14:00:00 via INTRAVENOUS

## 2016-08-07 MED ORDER — ZOLEDRONIC ACID 5 MG/100ML IV SOLN
5.0000 mg | Freq: Once | INTRAVENOUS | Status: AC
  Administered 2016-08-07: 5 mg via INTRAVENOUS
  Filled 2016-08-07: qty 100

## 2016-08-07 NOTE — Progress Notes (Signed)
Uneventful reclast infusion.   Pt takes daily calcium and vitamin D.  D/c instructions on reclast given to pt.  Pt stayed in room a little longer when infusion complete, pt states her son is getting an infusion in the cancer center right now.  Pt left at 1530, ambulatory to lobby.

## 2016-08-07 NOTE — Discharge Instructions (Signed)
Zoledronic Acid injection (Paget's Disease, Osteoporosis) °What is this medicine? °ZOLEDRONIC ACID (ZOE le dron ik AS id) lowers the amount of calcium loss from bone. It is used to treat Paget's disease and osteoporosis in women. °COMMON BRAND NAME(S): Reclast, Zometa °What should I tell my health care provider before I take this medicine? °They need to know if you have any of these conditions: °-aspirin-sensitive asthma °-cancer, especially if you are receiving medicines used to treat cancer °-dental disease or wear dentures °-infection °-kidney disease °-low levels of calcium in the blood °-past surgery on the parathyroid gland or intestines °-receiving corticosteroids like dexamethasone or prednisone °-an unusual or allergic reaction to zoledronic acid, other medicines, foods, dyes, or preservatives °-pregnant or trying to get pregnant °-breast-feeding °How should I use this medicine? °This medicine is for infusion into a vein. It is given by a health care professional in a hospital or clinic setting. °Talk to your pediatrician regarding the use of this medicine in children. This medicine is not approved for use in children. °What if I miss a dose? °It is important not to miss your dose. Call your doctor or health care professional if you are unable to keep an appointment. °What may interact with this medicine? °-certain antibiotics given by injection °-NSAIDs, medicines for pain and inflammation, like ibuprofen or naproxen °-some diuretics like bumetanide, furosemide °-teriparatide °What should I watch for while using this medicine? °Visit your doctor or health care professional for regular checkups. It may be some time before you see the benefit from this medicine. Do not stop taking your medicine unless your doctor tells you to. Your doctor may order blood tests or other tests to see how you are doing. °Women should inform their doctor if they wish to become pregnant or think they might be pregnant. There is a  potential for serious side effects to an unborn child. Talk to your health care professional or pharmacist for more information. °You should make sure that you get enough calcium and vitamin D while you are taking this medicine. Discuss the foods you eat and the vitamins you take with your health care professional. °Some people who take this medicine have severe bone, joint, and/or muscle pain. This medicine may also increase your risk for jaw problems or a broken thigh bone. Tell your doctor right away if you have severe pain in your jaw, bones, joints, or muscles. Tell your doctor if you have any pain that does not go away or that gets worse. °Tell your dentist and dental surgeon that you are taking this medicine. You should not have major dental surgery while on this medicine. See your dentist to have a dental exam and fix any dental problems before starting this medicine. Take good care of your teeth while on this medicine. Make sure you see your dentist for regular follow-up appointments. °What side effects may I notice from receiving this medicine? °Side effects that you should report to your doctor or health care professional as soon as possible: °-allergic reactions like skin rash, itching or hives, swelling of the face, lips, or tongue °-anxiety, confusion, or depression °-breathing problems °-changes in vision °-eye pain °-feeling faint or lightheaded, falls °-jaw pain, especially after dental work °-mouth sores °-muscle cramps, stiffness, or weakness °-redness, blistering, peeling or loosening of the skin, including inside the mouth °-trouble passing urine or change in the amount of urine °Side effects that usually do not require medical attention (report to your doctor or health care professional if   they continue or are bothersome): °-bone, joint, or muscle pain °-constipation °-diarrhea °-fever °-hair loss °-irritation at site where injected °-loss of appetite °-nausea, vomiting °-stomach  upset °-trouble sleeping °-trouble swallowing °-weak or tired °Where should I keep my medicine? °This drug is given in a hospital or clinic and will not be stored at home. °© 2017 [employer] Standard (2013-10-28 14:19:57) ° °

## 2017-08-13 ENCOUNTER — Other Ambulatory Visit: Payer: Self-pay | Admitting: Family Medicine

## 2017-08-13 DIAGNOSIS — E079 Disorder of thyroid, unspecified: Secondary | ICD-10-CM

## 2017-08-13 DIAGNOSIS — E0789 Other specified disorders of thyroid: Secondary | ICD-10-CM

## 2017-08-17 ENCOUNTER — Ambulatory Visit
Admission: RE | Admit: 2017-08-17 | Discharge: 2017-08-17 | Disposition: A | Discharge status: Home / Self Care | Payer: Medicare Other | Source: Ambulatory Visit | Attending: Family Medicine | Admitting: Family Medicine

## 2017-08-17 DIAGNOSIS — E079 Disorder of thyroid, unspecified: Secondary | ICD-10-CM

## 2017-08-17 DIAGNOSIS — E0789 Other specified disorders of thyroid: Secondary | ICD-10-CM

## 2017-09-27 ENCOUNTER — Ambulatory Visit
Admission: RE | Admit: 2017-09-27 | Discharge: 2017-09-27 | Disposition: A | Discharge status: Home / Self Care | Payer: Medicare Other | Source: Ambulatory Visit | Attending: Family Medicine | Admitting: Family Medicine

## 2017-09-27 ENCOUNTER — Other Ambulatory Visit: Payer: Self-pay | Admitting: Family Medicine

## 2017-09-27 DIAGNOSIS — R059 Cough, unspecified: Secondary | ICD-10-CM

## 2017-09-27 DIAGNOSIS — R05 Cough: Secondary | ICD-10-CM

## 2017-11-19 ENCOUNTER — Ambulatory Visit
Admission: RE | Admit: 2017-11-19 | Discharge: 2017-11-19 | Disposition: A | Discharge status: Home / Self Care | Payer: Medicare Other | Source: Ambulatory Visit | Attending: Family Medicine | Admitting: Family Medicine

## 2017-11-19 ENCOUNTER — Other Ambulatory Visit: Payer: Self-pay | Admitting: Family Medicine

## 2017-11-19 DIAGNOSIS — R9389 Abnormal findings on diagnostic imaging of other specified body structures: Secondary | ICD-10-CM

## 2017-12-29 ENCOUNTER — Ambulatory Visit: Payer: Medicare Other | Admitting: Pulmonary Disease

## 2017-12-29 ENCOUNTER — Encounter: Payer: Self-pay | Admitting: Pulmonary Disease

## 2017-12-29 VITALS — BP 128/78 | HR 86 | Ht 60.0 in | Wt 200.0 lb

## 2017-12-29 DIAGNOSIS — R05 Cough: Secondary | ICD-10-CM

## 2017-12-29 DIAGNOSIS — J4520 Mild intermittent asthma, uncomplicated: Secondary | ICD-10-CM

## 2017-12-29 DIAGNOSIS — R058 Other specified cough: Secondary | ICD-10-CM

## 2017-12-29 DIAGNOSIS — J986 Disorders of diaphragm: Secondary | ICD-10-CM | POA: Diagnosis not present

## 2017-12-29 DIAGNOSIS — R059 Cough, unspecified: Secondary | ICD-10-CM

## 2017-12-29 LAB — NITRIC OXIDE: Nitric Oxide: 15

## 2017-12-29 NOTE — Progress Notes (Signed)
  Subjective:     Patient ID: Amanda Hanna, female   DOB: 1941-05-30, 77 y.o.   MRN: [id number]  HPI   Review of Systems  Constitutional: Negative for fever and unexpected weight change.  HENT: Positive for sneezing. Negative for congestion, dental problem, ear pain, nosebleeds, postnasal drip, rhinorrhea, sinus pressure, sore throat and trouble swallowing.   Eyes: Negative for redness and itching.  Respiratory: Positive for cough. Negative for chest tightness, shortness of breath and wheezing.   Cardiovascular: Positive for leg swelling. Negative for palpitations.  Gastrointestinal: Negative for nausea and vomiting.  Genitourinary: Negative for dysuria.  Musculoskeletal: Negative for joint swelling.  Skin: Negative for rash.  Neurological: Negative for headaches.  Hematological: Does not bruise/bleed easily.  Psychiatric/Behavioral: Negative for dysphoric mood. The patient is not nervous/anxious.        Objective:   Physical Exam     Assessment:        Plan:

## 2017-12-29 NOTE — Progress Notes (Signed)
[facility]  Chief Complaint  Patient presents with  . pulmonary consult    per Dr. Dema Severin. hx with asthma & bronchitis 08/2017- lingering prod cough with clear mucus mainly in the morning & sob with exertion.     Constitutional: BP 128/78 (BP Location: Left Arm, Cuff Size: Normal)   Pulse 86   Ht 5' (1.524 m)   Wt 200 lb (90.7 kg)   SpO2 96%   BMI 39.06 kg/m   History of Present Illness: Amanda Hanna is a 77 y.o. female with cough.  She has history of asthma and deviated septum.  She was having sinus congestion, post nasal drip, and cough.  She then had chest congestion and wheezing.  Her symptoms are better now.  She had surgery on her nose years ago.  She had walking pneumonia years ago.  She snores, and sometimes feels like her breathing is shallow at night.  She gets sleepy during the day at times.  She never smoked.  She has pet cat.  No recent travel or sick exposures.  No hx of TB.    Her recent chest xray showed elevated right diaphragm.  She gets winded if she walks up two flights of stairs, but does okay on level ground.  FeNO and spirometry normal today.  Comprehensive Respiratory Exam:  Appearance - well kempt  ENMT - nasal mucosa moist, turbinates clear, mild nasal septum deviation, no dental lesions, no gingival bleeding, no oral exudates, no tonsillar hypertrophy Neck - no masses, trachea midline, no thyromegaly, no elevation in JVP Respiratory - normal appearance of chest wall, normal respiratory effort w/o accessory muscle use, no dullness on percussion, no tactile fremitus, no wheezing or rales CV - s1s2 regular rate and rhythm, no murmurs, no peripheral edema, mild varicosities in legs Rt > Lt, radial pulses symmetric GI - soft, non tender, no masses, no hepatosplenomegaly Lymph - no adenopathy noted in neck and axillary areas MSK - normal muscle strength and tone, normal gait Ext - no cyanosis, clubbing, or joint  inflammation noted Skin - no rashes, lesions, or ulcers Neuro - oriented to person, place, and time Psych - normal mood and affect  Discussion: She has deviated nasal septum.  She has chronic rhinitis with post nasal drip with triggers cough.  She is likely prone to asthmatic bronchitis when her allergies flare or she has a respiratory infection.  She was noted to have elevated Rt diaphragm on recent CXR.  She snores and has some difficulty with her breathing at night.  Assessment/Plan:  Upper airway cough with post nasal drip. - continue flonase  Asthmatic bronchitis. - mild, intermittent - prn albuterol  Elevated right diaphragm. - will arrange for SNIFF test  Snoring. - monitor sleep pattern - might need home sleep study to further assess for sleep apnea  Obesity. - discussed importance of weight loss   Patient Instructions  Will arrange for SNIFF test  Follow up in 4 months    Chesley Mires, MD [place] 12/29/2017, 11:49 AM  Flow Sheet  Pulmonary tests: FeNO 12/29/17 >> 15 Spirometry 12/29/17 >> FEV1 1.99 (117%), FEV1% 90  Sleep tests:  Review of Systems: Constitutional: Negative for fever and unexpected weight change.  HENT: Positive for sneezing. Negative for congestion, dental problem, ear pain, nosebleeds, postnasal drip, rhinorrhea, sinus pressure, sore throat and trouble swallowing.   Eyes: Negative for redness and itching.  Respiratory: Positive for cough. Negative for chest tightness, shortness  of breath and wheezing.   Cardiovascular: Positive for leg swelling. Negative for palpitations.  Gastrointestinal: Negative for nausea and vomiting.  Genitourinary: Negative for dysuria.  Musculoskeletal: Negative for joint swelling.  Skin: Negative for rash.  Neurological: Negative for headaches.  Hematological: Does not bruise/bleed easily.  Psychiatric/Behavioral: Negative for dysphoric mood. The patient is not nervous/anxious.     Past Medical History: She  has a past medical history of Depression, Facial basal cell cancer, Hypercholesteremia, Osteoporosis, Sinusitis, Squamous cell cancer of skin of nose (2010), and Varicose vein.  Past Surgical History: She  has a past surgical history that includes removal squamous cell cancer nose; Vaginal delivery; deviated septum  repair; eyelid surgery; basal cell carcinoma from neck; Colonoscopy; and osteoporosis.  Family History: Her family history includes Cancer in her father and son.  Social History: She  reports that she has never smoked. She has never used smokeless tobacco. She reports that she does not drink alcohol or use drugs.  Medications: Allergies as of 12/29/2017      Reactions   Boniva [ibandronic Acid] Other (See Comments)   Pt took it several years ago and states ":It made me very sore all over"   Septra [sulfamethoxazole-trimethoprim]    Sulfa Antibiotics Swelling   On face      Medication List        Accurate as of 12/29/17 11:49 AM. Always use your most recent med list.          atorvastatin 20 MG tablet Commonly known as:  LIPITOR Take 20 mg by mouth daily.   calcium carbonate 500 MG chewable tablet Commonly known as:  TUMS - dosed in mg elemental calcium Chew 1 tablet by mouth daily.   cholecalciferol 1000 units tablet Commonly known as:  VITAMIN D Take 1,000 Units by mouth daily.   fluticasone 50 MCG/ACT nasal spray Commonly known as:  FLONASE Place into both nostrils daily.   multivitamin with minerals tablet Take 1 tablet by mouth daily.   PROAIR HFA 108 (90 Base) MCG/ACT inhaler Generic drug:  albuterol   triamterene-hydrochlorothiazide 37.5-25 MG capsule Commonly known as:  DYAZIDE Take 1 capsule by mouth daily.

## 2017-12-29 NOTE — Patient Instructions (Signed)
Will arrange for SNIFF test  Follow up in 4 months

## 2018-01-05 ENCOUNTER — Ambulatory Visit (HOSPITAL_COMMUNITY)
Admission: RE | Admit: 2018-01-05 | Discharge: 2018-01-05 | Disposition: A | Discharge status: Home / Self Care | Payer: Medicare Other | Source: Ambulatory Visit | Attending: Pulmonary Disease | Admitting: Pulmonary Disease

## 2018-01-05 DIAGNOSIS — J986 Disorders of diaphragm: Secondary | ICD-10-CM

## 2018-01-06 ENCOUNTER — Other Ambulatory Visit: Payer: Self-pay | Admitting: Family Medicine

## 2018-01-06 DIAGNOSIS — Z1231 Encounter for screening mammogram for malignant neoplasm of breast: Secondary | ICD-10-CM

## 2018-01-13 ENCOUNTER — Telehealth: Payer: Self-pay | Admitting: Pulmonary Disease

## 2018-01-13 NOTE — Telephone Encounter (Signed)
Called and spoke with patient regarding results.  Informed the patient of results and recommendations today. Pt verbalized understanding and denied any questions or concerns at this time.  Nothing further needed.  

## 2018-01-13 NOTE — Telephone Encounter (Signed)
Dg Sniff Test  Result Date: 01/05/2018 CLINICAL DATA:  Elevated right hemidiaphragm on recent chest radiographs. EXAM: CHEST FLUOROSCOPY TECHNIQUE: Real-time fluoroscopic evaluation of the chest was performed. FLUOROSCOPY TIME:  Fluoroscopy Time:  0 minutes 55 seconds Radiation Exposure Index (if provided by the fluoroscopic device): 3.40 mGy Number of Acquired Spot Images: 3 COMPARISON:  None. FINDINGS: There was normal movement of both hemidiaphragms with inspiration and expiration. There is also normal movement of both hemidiaphragms with sniff testing. 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 The cardiac silhouette remains borderline enlarged and the lungs are clear. Unremarkable bones. IMPRESSION: The recently suspected elevation of the right hemidiaphragm is actually a right anterior diaphragmatic eventration. There is no pathological elevation of the hemidiaphragm and no diaphragmatic paralysis is demonstrated. Electronically Signed   By: Claudie Revering M.D.   On: 01/05/2018 12:35    Please let her know that SNIFF test showed normal movement of her diaphragm.

## 2018-03-24 ENCOUNTER — Ambulatory Visit
Admission: RE | Admit: 2018-03-24 | Discharge: 2018-03-24 | Disposition: A | Discharge status: Home / Self Care | Payer: Medicare Other | Source: Ambulatory Visit | Attending: Family Medicine | Admitting: Family Medicine

## 2018-03-24 DIAGNOSIS — Z1231 Encounter for screening mammogram for malignant neoplasm of breast: Secondary | ICD-10-CM

## 2018-05-23 ENCOUNTER — Other Ambulatory Visit: Payer: Self-pay | Admitting: Family Medicine

## 2018-05-23 DIAGNOSIS — I83811 Varicose veins of right lower extremities with pain: Secondary | ICD-10-CM

## 2018-05-24 ENCOUNTER — Ambulatory Visit
Admission: RE | Admit: 2018-05-24 | Discharge: 2018-05-24 | Disposition: A | Discharge status: Home / Self Care | Payer: Medicare Other | Source: Ambulatory Visit | Attending: Family Medicine | Admitting: Family Medicine

## 2018-05-24 DIAGNOSIS — I83811 Varicose veins of right lower extremities with pain: Secondary | ICD-10-CM

## 2018-05-30 ENCOUNTER — Other Ambulatory Visit: Payer: Self-pay

## 2018-05-30 DIAGNOSIS — I83891 Varicose veins of right lower extremities with other complications: Secondary | ICD-10-CM

## 2018-06-22 ENCOUNTER — Ambulatory Visit (HOSPITAL_COMMUNITY)
Admission: RE | Admit: 2018-06-22 | Discharge: 2018-06-22 | Disposition: A | Discharge status: Home / Self Care | Payer: Medicare Other | Source: Ambulatory Visit | Attending: Family Medicine | Admitting: Family Medicine

## 2018-06-22 ENCOUNTER — Encounter (HOSPITAL_COMMUNITY): Payer: Self-pay

## 2018-06-22 DIAGNOSIS — M810 Age-related osteoporosis without current pathological fracture: Secondary | ICD-10-CM | POA: Diagnosis present

## 2018-06-22 MED ORDER — ZOLEDRONIC ACID 5 MG/100ML IV SOLN
5.0000 mg | Freq: Once | INTRAVENOUS | Status: AC
  Administered 2018-06-22: 5 mg via INTRAVENOUS
  Filled 2018-06-22: qty 100

## 2018-06-22 MED ORDER — SODIUM CHLORIDE 0.9 % IV SOLN
Freq: Once | INTRAVENOUS | Status: AC
  Administered 2018-06-22: 13:00:00 via INTRAVENOUS

## 2018-06-22 NOTE — Discharge Instructions (Signed)

## 2018-08-04 ENCOUNTER — Encounter (HOSPITAL_COMMUNITY): Payer: Medicare Other

## 2018-08-04 ENCOUNTER — Encounter: Payer: Medicare Other | Admitting: Vascular Surgery

## 2018-08-25 ENCOUNTER — Other Ambulatory Visit: Payer: Self-pay | Admitting: Family Medicine

## 2018-08-25 DIAGNOSIS — E041 Nontoxic single thyroid nodule: Secondary | ICD-10-CM

## 2018-08-29 ENCOUNTER — Other Ambulatory Visit: Payer: Self-pay

## 2018-08-29 ENCOUNTER — Ambulatory Visit
Admission: RE | Admit: 2018-08-29 | Discharge: 2018-08-29 | Disposition: A | Discharge status: Home / Self Care | Payer: Medicare Other | Source: Ambulatory Visit | Attending: Family Medicine | Admitting: Family Medicine

## 2018-08-29 DIAGNOSIS — E041 Nontoxic single thyroid nodule: Secondary | ICD-10-CM

## 2018-09-15 ENCOUNTER — Encounter (HOSPITAL_COMMUNITY): Payer: Medicare Other

## 2018-09-15 ENCOUNTER — Encounter: Payer: Medicare Other | Admitting: Vascular Surgery

## 2019-04-17 ENCOUNTER — Other Ambulatory Visit: Payer: Self-pay | Admitting: Family Medicine

## 2019-04-17 DIAGNOSIS — Z1231 Encounter for screening mammogram for malignant neoplasm of breast: Secondary | ICD-10-CM

## 2019-06-06 ENCOUNTER — Other Ambulatory Visit: Payer: Self-pay

## 2019-06-06 ENCOUNTER — Ambulatory Visit
Admission: RE | Admit: 2019-06-06 | Discharge: 2019-06-06 | Disposition: A | Discharge status: Home / Self Care | Payer: Medicare Other | Source: Ambulatory Visit | Attending: Family Medicine | Admitting: Family Medicine

## 2019-06-06 DIAGNOSIS — Z1231 Encounter for screening mammogram for malignant neoplasm of breast: Secondary | ICD-10-CM

## 2019-06-26 ENCOUNTER — Encounter (HOSPITAL_COMMUNITY): Payer: Medicare Other

## 2019-08-11 ENCOUNTER — Ambulatory Visit: Payer: Medicare Other

## 2019-09-12 ENCOUNTER — Other Ambulatory Visit: Payer: Self-pay | Admitting: Family Medicine

## 2019-09-13 ENCOUNTER — Other Ambulatory Visit: Payer: Self-pay | Admitting: Family Medicine

## 2019-09-13 DIAGNOSIS — E042 Nontoxic multinodular goiter: Secondary | ICD-10-CM

## 2019-09-18 ENCOUNTER — Ambulatory Visit
Admission: RE | Admit: 2019-09-18 | Discharge: 2019-09-18 | Disposition: A | Discharge status: Home / Self Care | Payer: Medicare Other | Source: Ambulatory Visit | Attending: Family Medicine | Admitting: Family Medicine

## 2019-09-18 DIAGNOSIS — E042 Nontoxic multinodular goiter: Secondary | ICD-10-CM

## 2019-10-12 ENCOUNTER — Other Ambulatory Visit: Payer: Self-pay | Admitting: Orthopaedic Surgery

## 2019-10-12 DIAGNOSIS — M48062 Spinal stenosis, lumbar region with neurogenic claudication: Secondary | ICD-10-CM

## 2019-11-03 ENCOUNTER — Ambulatory Visit
Admission: RE | Admit: 2019-11-03 | Discharge: 2019-11-03 | Disposition: A | Discharge status: Home / Self Care | Payer: Medicare Other | Source: Ambulatory Visit | Attending: Orthopaedic Surgery | Admitting: Orthopaedic Surgery

## 2019-11-03 ENCOUNTER — Other Ambulatory Visit: Payer: Self-pay

## 2019-11-03 DIAGNOSIS — M48062 Spinal stenosis, lumbar region with neurogenic claudication: Secondary | ICD-10-CM

## 2019-11-07 ENCOUNTER — Other Ambulatory Visit: Payer: Medicare Other

## 2020-05-03 ENCOUNTER — Other Ambulatory Visit: Payer: Self-pay | Admitting: Family Medicine

## 2020-05-03 DIAGNOSIS — Z1231 Encounter for screening mammogram for malignant neoplasm of breast: Secondary | ICD-10-CM

## 2020-06-06 ENCOUNTER — Other Ambulatory Visit: Payer: Self-pay | Admitting: Family Medicine

## 2020-06-06 DIAGNOSIS — M810 Age-related osteoporosis without current pathological fracture: Secondary | ICD-10-CM

## 2020-06-19 ENCOUNTER — Ambulatory Visit
Admission: RE | Admit: 2020-06-19 | Discharge: 2020-06-19 | Disposition: A | Discharge status: Home / Self Care | Payer: Medicare Other | Source: Ambulatory Visit | Attending: Family Medicine | Admitting: Family Medicine

## 2020-06-19 ENCOUNTER — Other Ambulatory Visit: Payer: Self-pay

## 2020-06-19 DIAGNOSIS — Z1231 Encounter for screening mammogram for malignant neoplasm of breast: Secondary | ICD-10-CM | POA: Diagnosis not present

## 2020-07-09 DIAGNOSIS — M48062 Spinal stenosis, lumbar region with neurogenic claudication: Secondary | ICD-10-CM | POA: Diagnosis not present

## 2020-08-12 DIAGNOSIS — M5136 Other intervertebral disc degeneration, lumbar region: Secondary | ICD-10-CM | POA: Diagnosis not present

## 2020-08-20 DIAGNOSIS — E785 Hyperlipidemia, unspecified: Secondary | ICD-10-CM | POA: Diagnosis not present

## 2020-08-20 DIAGNOSIS — Z1159 Encounter for screening for other viral diseases: Secondary | ICD-10-CM | POA: Diagnosis not present

## 2020-08-20 DIAGNOSIS — E559 Vitamin D deficiency, unspecified: Secondary | ICD-10-CM | POA: Diagnosis not present

## 2020-09-02 DIAGNOSIS — L218 Other seborrheic dermatitis: Secondary | ICD-10-CM | POA: Diagnosis not present

## 2020-09-02 DIAGNOSIS — L821 Other seborrheic keratosis: Secondary | ICD-10-CM | POA: Diagnosis not present

## 2020-09-17 DIAGNOSIS — M25552 Pain in left hip: Secondary | ICD-10-CM | POA: Diagnosis not present

## 2020-09-17 DIAGNOSIS — M5416 Radiculopathy, lumbar region: Secondary | ICD-10-CM | POA: Diagnosis not present

## 2020-09-18 ENCOUNTER — Other Ambulatory Visit: Payer: Self-pay | Admitting: Physical Medicine and Rehabilitation

## 2020-09-18 DIAGNOSIS — M545 Low back pain, unspecified: Secondary | ICD-10-CM

## 2020-09-24 ENCOUNTER — Other Ambulatory Visit: Payer: Self-pay

## 2020-09-24 ENCOUNTER — Ambulatory Visit
Admission: RE | Admit: 2020-09-24 | Discharge: 2020-09-24 | Disposition: A | Discharge status: Home / Self Care | Payer: Medicare Other | Source: Ambulatory Visit | Attending: Family Medicine | Admitting: Family Medicine

## 2020-09-24 DIAGNOSIS — M8589 Other specified disorders of bone density and structure, multiple sites: Secondary | ICD-10-CM | POA: Diagnosis not present

## 2020-09-24 DIAGNOSIS — Z780 Asymptomatic menopausal state: Secondary | ICD-10-CM | POA: Diagnosis not present

## 2020-09-24 DIAGNOSIS — M810 Age-related osteoporosis without current pathological fracture: Secondary | ICD-10-CM

## 2020-10-08 ENCOUNTER — Ambulatory Visit
Admission: RE | Admit: 2020-10-08 | Discharge: 2020-10-08 | Disposition: A | Discharge status: Home / Self Care | Payer: Medicare Other | Source: Ambulatory Visit | Attending: Physical Medicine and Rehabilitation | Admitting: Physical Medicine and Rehabilitation

## 2020-10-08 DIAGNOSIS — M48061 Spinal stenosis, lumbar region without neurogenic claudication: Secondary | ICD-10-CM | POA: Diagnosis not present

## 2020-10-08 DIAGNOSIS — M545 Low back pain, unspecified: Secondary | ICD-10-CM

## 2020-10-16 DIAGNOSIS — M5416 Radiculopathy, lumbar region: Secondary | ICD-10-CM | POA: Diagnosis not present

## 2020-10-23 DIAGNOSIS — M5416 Radiculopathy, lumbar region: Secondary | ICD-10-CM | POA: Diagnosis not present

## 2020-11-12 DIAGNOSIS — M47816 Spondylosis without myelopathy or radiculopathy, lumbar region: Secondary | ICD-10-CM | POA: Diagnosis not present

## 2020-11-20 DIAGNOSIS — M47816 Spondylosis without myelopathy or radiculopathy, lumbar region: Secondary | ICD-10-CM | POA: Diagnosis not present

## 2020-11-21 DIAGNOSIS — M810 Age-related osteoporosis without current pathological fracture: Secondary | ICD-10-CM | POA: Diagnosis not present

## 2020-11-21 DIAGNOSIS — R030 Elevated blood-pressure reading, without diagnosis of hypertension: Secondary | ICD-10-CM | POA: Diagnosis not present

## 2020-11-21 DIAGNOSIS — E785 Hyperlipidemia, unspecified: Secondary | ICD-10-CM | POA: Diagnosis not present

## 2020-11-21 DIAGNOSIS — M5136 Other intervertebral disc degeneration, lumbar region: Secondary | ICD-10-CM | POA: Diagnosis not present

## 2020-11-21 DIAGNOSIS — E559 Vitamin D deficiency, unspecified: Secondary | ICD-10-CM | POA: Diagnosis not present

## 2020-12-04 DIAGNOSIS — M810 Age-related osteoporosis without current pathological fracture: Secondary | ICD-10-CM | POA: Diagnosis not present

## 2020-12-12 DIAGNOSIS — M5416 Radiculopathy, lumbar region: Secondary | ICD-10-CM | POA: Diagnosis not present

## 2020-12-12 DIAGNOSIS — M47816 Spondylosis without myelopathy or radiculopathy, lumbar region: Secondary | ICD-10-CM | POA: Diagnosis not present

## 2020-12-25 DIAGNOSIS — M5416 Radiculopathy, lumbar region: Secondary | ICD-10-CM | POA: Diagnosis not present

## 2021-01-23 DIAGNOSIS — M47816 Spondylosis without myelopathy or radiculopathy, lumbar region: Secondary | ICD-10-CM | POA: Diagnosis not present

## 2021-01-29 DIAGNOSIS — E785 Hyperlipidemia, unspecified: Secondary | ICD-10-CM | POA: Diagnosis not present

## 2021-01-29 DIAGNOSIS — Z79899 Other long term (current) drug therapy: Secondary | ICD-10-CM | POA: Diagnosis not present

## 2021-03-19 DIAGNOSIS — M5126 Other intervertebral disc displacement, lumbar region: Secondary | ICD-10-CM | POA: Diagnosis not present

## 2021-03-20 ENCOUNTER — Other Ambulatory Visit: Payer: Self-pay | Admitting: Neurological Surgery

## 2021-03-25 ENCOUNTER — Other Ambulatory Visit: Payer: Self-pay

## 2021-03-25 ENCOUNTER — Encounter (HOSPITAL_COMMUNITY)
Admission: RE | Admit: 2021-03-25 | Discharge: 2021-03-25 | Disposition: A | Discharge status: Home / Self Care | Payer: Medicare Other | Source: Ambulatory Visit | Attending: Neurological Surgery | Admitting: Neurological Surgery

## 2021-03-25 ENCOUNTER — Encounter (HOSPITAL_COMMUNITY): Payer: Self-pay

## 2021-03-25 DIAGNOSIS — Z981 Arthrodesis status: Secondary | ICD-10-CM | POA: Diagnosis not present

## 2021-03-25 DIAGNOSIS — F32A Depression, unspecified: Secondary | ICD-10-CM | POA: Diagnosis not present

## 2021-03-25 DIAGNOSIS — M47816 Spondylosis without myelopathy or radiculopathy, lumbar region: Secondary | ICD-10-CM | POA: Diagnosis not present

## 2021-03-25 DIAGNOSIS — R0902 Hypoxemia: Secondary | ICD-10-CM | POA: Diagnosis not present

## 2021-03-25 DIAGNOSIS — E7800 Pure hypercholesterolemia, unspecified: Secondary | ICD-10-CM | POA: Diagnosis not present

## 2021-03-25 DIAGNOSIS — M62838 Other muscle spasm: Secondary | ICD-10-CM | POA: Diagnosis not present

## 2021-03-25 DIAGNOSIS — M810 Age-related osteoporosis without current pathological fracture: Secondary | ICD-10-CM | POA: Diagnosis not present

## 2021-03-25 DIAGNOSIS — I8390 Asymptomatic varicose veins of unspecified lower extremity: Secondary | ICD-10-CM | POA: Diagnosis not present

## 2021-03-25 DIAGNOSIS — Z79899 Other long term (current) drug therapy: Secondary | ICD-10-CM | POA: Diagnosis not present

## 2021-03-25 DIAGNOSIS — Z882 Allergy status to sulfonamides status: Secondary | ICD-10-CM | POA: Diagnosis not present

## 2021-03-25 DIAGNOSIS — K30 Functional dyspepsia: Secondary | ICD-10-CM | POA: Diagnosis not present

## 2021-03-25 DIAGNOSIS — B965 Pseudomonas (aeruginosa) (mallei) (pseudomallei) as the cause of diseases classified elsewhere: Secondary | ICD-10-CM | POA: Diagnosis not present

## 2021-03-25 DIAGNOSIS — Z01812 Encounter for preprocedural laboratory examination: Secondary | ICD-10-CM | POA: Insufficient documentation

## 2021-03-25 DIAGNOSIS — Z743 Need for continuous supervision: Secondary | ICD-10-CM | POA: Diagnosis not present

## 2021-03-25 DIAGNOSIS — Z20822 Contact with and (suspected) exposure to covid-19: Secondary | ICD-10-CM | POA: Insufficient documentation

## 2021-03-25 DIAGNOSIS — M4316 Spondylolisthesis, lumbar region: Secondary | ICD-10-CM | POA: Diagnosis not present

## 2021-03-25 DIAGNOSIS — E559 Vitamin D deficiency, unspecified: Secondary | ICD-10-CM | POA: Diagnosis not present

## 2021-03-25 DIAGNOSIS — R404 Transient alteration of awareness: Secondary | ICD-10-CM | POA: Diagnosis not present

## 2021-03-25 DIAGNOSIS — N390 Urinary tract infection, site not specified: Secondary | ICD-10-CM | POA: Diagnosis not present

## 2021-03-25 DIAGNOSIS — M4326 Fusion of spine, lumbar region: Secondary | ICD-10-CM | POA: Diagnosis not present

## 2021-03-25 DIAGNOSIS — M25511 Pain in right shoulder: Secondary | ICD-10-CM | POA: Diagnosis not present

## 2021-03-25 DIAGNOSIS — M4726 Other spondylosis with radiculopathy, lumbar region: Secondary | ICD-10-CM | POA: Diagnosis not present

## 2021-03-25 DIAGNOSIS — M5126 Other intervertebral disc displacement, lumbar region: Secondary | ICD-10-CM | POA: Diagnosis not present

## 2021-03-25 DIAGNOSIS — D72829 Elevated white blood cell count, unspecified: Secondary | ICD-10-CM | POA: Diagnosis not present

## 2021-03-25 DIAGNOSIS — Z888 Allergy status to other drugs, medicaments and biological substances status: Secondary | ICD-10-CM | POA: Diagnosis not present

## 2021-03-25 DIAGNOSIS — Z4789 Encounter for other orthopedic aftercare: Secondary | ICD-10-CM | POA: Diagnosis not present

## 2021-03-25 DIAGNOSIS — M5116 Intervertebral disc disorders with radiculopathy, lumbar region: Secondary | ICD-10-CM | POA: Diagnosis not present

## 2021-03-25 DIAGNOSIS — J329 Chronic sinusitis, unspecified: Secondary | ICD-10-CM | POA: Diagnosis not present

## 2021-03-25 DIAGNOSIS — M545 Low back pain, unspecified: Secondary | ICD-10-CM | POA: Diagnosis not present

## 2021-03-25 DIAGNOSIS — Z85828 Personal history of other malignant neoplasm of skin: Secondary | ICD-10-CM | POA: Diagnosis not present

## 2021-03-25 LAB — CBC
HCT: 45.1 % (ref 36.0–46.0)
Hemoglobin: 15.2 g/dL — ABNORMAL HIGH (ref 12.0–15.0)
MCH: 32.2 pg (ref 26.0–34.0)
MCHC: 33.7 g/dL (ref 30.0–36.0)
MCV: 95.6 fL (ref 80.0–100.0)
Platelets: 282 10*3/uL (ref 150–400)
RBC: 4.72 MIL/uL (ref 3.87–5.11)
RDW: 12.1 % (ref 11.5–15.5)
WBC: 8.9 10*3/uL (ref 4.0–10.5)
nRBC: 0 % (ref 0.0–0.2)

## 2021-03-25 LAB — TYPE AND SCREEN
ABO/RH(D): A NEG
Antibody Screen: NEGATIVE

## 2021-03-25 LAB — SURGICAL PCR SCREEN
MRSA, PCR: NEGATIVE
Staphylococcus aureus: NEGATIVE

## 2021-03-25 NOTE — Progress Notes (Signed)
PCP - Dr. Harlan Stains Cardiologist - denies  PPM/ICD - denies   Chest x-ray - 11/19/17 EKG - denies Stress Test - denies ECHO - denies Cardiac Cath - denies  Sleep Study - denies   DM- denies  Blood Thinner Instructions: n/a Aspirin Instructions: n/a  ERAS Protcol - yes, no drink   COVID TEST- 03/25/21 at PAT appt, results pending   Anesthesia review: No  Patient denies shortness of breath, fever, cough and chest pain at PAT appointment   All instructions explained to the patient, with a verbal understanding of the material. Patient agrees to go over the instructions while at home for a better understanding. Patient also instructed to wear a mask in public after being tested for COVID-19. The opportunity to ask questions was provided.

## 2021-03-25 NOTE — Pre-Procedure Instructions (Signed)
Surgical Instructions   Your procedure is scheduled on Thursday, October 13th. Report to [facility] Main Entrance "A" at 12:15 P.M., then check in with the Admitting office. Call this number if you have problems the morning of surgery: [phone]   If you have any questions prior to your surgery date call [phone]: Open Monday-Friday 8am-4pm   Remember: Do not eat after midnight the night before your surgery  You may drink clear liquids until 11:15 AM the morning of your surgery.   Clear liquids allowed are: Water, Non-Citrus Juices (without pulp), Carbonated Beverages, Clear Tea, Black Coffee Only, and Gatorade   Take these medicines the morning of surgery with A SIP OF WATER  acetaminophen (TYLENOL) atorvastatin (LIPITOR) calcium carbonate (TUMS - DOSED IN MG ELEMENTAL CALCIUM)- if needed fluticasone (FLONASE)- if needed omeprazole (PRILOSEC)- if needed    As of today, STOP taking any Aspirin (unless otherwise instructed by your surgeon) Aleve, Naproxen, Ibuprofen, Motrin, Advil, Goody's, BC's, all herbal medications, fish oil, and all vitamins.                     Do NOT Smoke (Tobacco/Vaping) or drink Alcohol 24 hours prior to your procedure.  If you use a CPAP at night, you may bring all equipment for your overnight stay.   Contacts, glasses, piercing's, hearing aid's, dentures or partials may not be worn into surgery, please bring cases for these belongings.    For patients admitted to the hospital, discharge time will be determined by your treatment team.   Patients discharged the day of surgery will not be allowed to drive home, and someone needs to stay with them for 24 hours.  NO VISITORS WILL BE ALLOWED IN PRE-OP WHERE PATIENTS GET READY FOR SURGERY.  ONLY 1 SUPPORT PERSON MAY BE PRESENT IN THE WAITING ROOM WHILE YOU ARE IN SURGERY.  IF YOU ARE TO BE ADMITTED, ONCE YOU ARE IN YOUR ROOM YOU WILL BE ALLOWED TWO (2) VISITORS.  Minor children may have two parents  present. Special consideration for safety and communication needs will be reviewed on a case by case basis.   Special instructions:   [facility]- Preparing For Surgery  Before surgery, you can play an important role. Because skin is not sterile, your skin needs to be as free of germs as possible. You can reduce the number of germs on your skin by washing with CHG (chlorahexidine gluconate) Soap before surgery.  CHG is an antiseptic cleaner which kills germs and bonds with the skin to continue killing germs even after washing.    Oral Hygiene is also important to reduce your risk of infection.  Remember - BRUSH YOUR TEETH THE MORNING OF SURGERY WITH YOUR REGULAR TOOTHPASTE  Please do not use if you have an allergy to CHG or antibacterial soaps. If your skin becomes reddened/irritated stop using the CHG.  Do not shave (including legs and underarms) for at least 48 hours prior to first CHG shower. It is OK to shave your face.  Please follow these instructions carefully.   Shower the NIGHT BEFORE SURGERY and the MORNING OF SURGERY  If you chose to wash your hair, wash your hair first as usual with your normal shampoo.  After you shampoo, rinse your hair and body thoroughly to remove the shampoo.  Use CHG Soap as you would any other liquid soap. You can apply CHG directly to the skin and wash gently with a scrungie or a clean washcloth.   Apply  the CHG Soap to your body ONLY FROM THE NECK DOWN.  Do not use on open wounds or open sores. Avoid contact with your eyes, ears, mouth and genitals (private parts). Wash Face and genitals (private parts)  with your normal soap.   Wash thoroughly, paying special attention to the area where your surgery will be performed.  Thoroughly rinse your body with warm water from the neck down.  DO NOT shower/wash with your normal soap after using and rinsing off the CHG Soap.  Pat yourself dry with a CLEAN TOWEL.  Wear CLEAN PAJAMAS to bed the night before  surgery  Place CLEAN SHEETS on your bed the night before your surgery  DO NOT SLEEP WITH PETS.   Day of Surgery: Shower with CHG soap. Do not wear jewelry, make up, nail polish, gel polish, artificial nails, or any other type of covering on natural nails including finger and toenails. If patients have artificial nails, gel coating, etc. that need to be removed by a nail salon please have this removed prior to surgery. Surgery may need to be canceled/delayed if the surgeon/ anesthesia feels like the patient is unable to be adequately monitored. Do not wear lotions, powders, perfumes, or deodorant. Do not shave 48 hours prior to surgery.   Do not bring valuables to the hospital. [facility] is not responsible for any belongings or valuables. Wear Clean/Comfortable clothing the morning of surgery Remember to brush your teeth WITH YOUR REGULAR TOOTHPASTE.   Please read over the following fact sheets that you were given.   3 days prior to your procedure or After your COVID test   You are not required to quarantine however you are required to wear a well-fitting mask when you are out and around people not in your household. If your mask becomes wet or soiled, replace with a new one.   Wash your hands often with soap and water for 20 seconds or clean your hands with an alcohol-based hand sanitizer that contains at least 60% alcohol.   Do not share personal items.   Notify your provider:  o if you are in close contact with someone who has COVID  o or if you develop a fever of 100.4 or greater, sneezing, cough, sore throat, shortness of breath or body aches.

## 2021-03-26 LAB — SARS CORONAVIRUS 2 (TAT 6-24 HRS): SARS Coronavirus 2: NEGATIVE

## 2021-03-27 ENCOUNTER — Inpatient Hospital Stay (HOSPITAL_COMMUNITY): Payer: Medicare Other

## 2021-03-27 ENCOUNTER — Inpatient Hospital Stay (HOSPITAL_COMMUNITY): Payer: Medicare Other | Admitting: Certified Registered"

## 2021-03-27 ENCOUNTER — Inpatient Hospital Stay (HOSPITAL_COMMUNITY)
Admission: RE | Disposition: A | Discharge status: Skilled Nursing Facility | Payer: Self-pay | Source: Home / Self Care | Attending: Neurological Surgery

## 2021-03-27 ENCOUNTER — Inpatient Hospital Stay (HOSPITAL_COMMUNITY)
Admission: RE | Admit: 2021-03-27 | Discharge: 2021-04-03 | DRG: 454 | Disposition: A | Discharge status: Skilled Nursing Facility | Payer: Medicare Other | Attending: Neurological Surgery | Admitting: Neurological Surgery

## 2021-03-27 DIAGNOSIS — Z882 Allergy status to sulfonamides status: Secondary | ICD-10-CM

## 2021-03-27 DIAGNOSIS — Z981 Arthrodesis status: Secondary | ICD-10-CM | POA: Diagnosis not present

## 2021-03-27 DIAGNOSIS — Z539 Procedure and treatment not carried out, unspecified reason: Secondary | ICD-10-CM

## 2021-03-27 DIAGNOSIS — M5116 Intervertebral disc disorders with radiculopathy, lumbar region: Principal | ICD-10-CM | POA: Diagnosis present

## 2021-03-27 DIAGNOSIS — M810 Age-related osteoporosis without current pathological fracture: Secondary | ICD-10-CM | POA: Diagnosis present

## 2021-03-27 DIAGNOSIS — Z888 Allergy status to other drugs, medicaments and biological substances status: Secondary | ICD-10-CM

## 2021-03-27 DIAGNOSIS — B965 Pseudomonas (aeruginosa) (mallei) (pseudomallei) as the cause of diseases classified elsewhere: Secondary | ICD-10-CM | POA: Diagnosis not present

## 2021-03-27 DIAGNOSIS — M4316 Spondylolisthesis, lumbar region: Secondary | ICD-10-CM | POA: Diagnosis present

## 2021-03-27 DIAGNOSIS — M5126 Other intervertebral disc displacement, lumbar region: Secondary | ICD-10-CM

## 2021-03-27 DIAGNOSIS — Z20822 Contact with and (suspected) exposure to covid-19: Secondary | ICD-10-CM | POA: Diagnosis present

## 2021-03-27 DIAGNOSIS — Z85828 Personal history of other malignant neoplasm of skin: Secondary | ICD-10-CM | POA: Diagnosis not present

## 2021-03-27 DIAGNOSIS — M4726 Other spondylosis with radiculopathy, lumbar region: Secondary | ICD-10-CM | POA: Diagnosis present

## 2021-03-27 DIAGNOSIS — Z79899 Other long term (current) drug therapy: Secondary | ICD-10-CM

## 2021-03-27 DIAGNOSIS — N390 Urinary tract infection, site not specified: Secondary | ICD-10-CM | POA: Diagnosis not present

## 2021-03-27 DIAGNOSIS — E7800 Pure hypercholesterolemia, unspecified: Secondary | ICD-10-CM | POA: Diagnosis present

## 2021-03-27 DIAGNOSIS — M4326 Fusion of spine, lumbar region: Secondary | ICD-10-CM | POA: Diagnosis not present

## 2021-03-27 DIAGNOSIS — Z419 Encounter for procedure for purposes other than remedying health state, unspecified: Secondary | ICD-10-CM

## 2021-03-27 HISTORY — PX: TRANSFORAMINAL LUMBAR INTERBODY FUSION (TLIF) WITH PEDICLE SCREW FIXATION 1 LEVEL: SHX6141

## 2021-03-27 LAB — ABO/RH: ABO/RH(D): A NEG

## 2021-03-27 SURGERY — TRANSFORAMINAL LUMBAR INTERBODY FUSION (TLIF) WITH PEDICLE SCREW FIXATION 1 LEVEL
Anesthesia: General | Laterality: Left

## 2021-03-27 MED ORDER — ONDANSETRON HCL 4 MG/2ML IJ SOLN
INTRAMUSCULAR | Status: AC
  Filled 2021-03-27: qty 2

## 2021-03-27 MED ORDER — LACTATED RINGERS IV SOLN: INTRAVENOUS | Status: DC

## 2021-03-27 MED ORDER — PHENYLEPHRINE 40 MCG/ML (10ML) SYRINGE FOR IV PUSH (FOR BLOOD PRESSURE SUPPORT)
PREFILLED_SYRINGE | INTRAVENOUS | Status: AC
  Filled 2021-03-27: qty 20

## 2021-03-27 MED ORDER — CEFAZOLIN SODIUM-DEXTROSE 2-4 GM/100ML-% IV SOLN
2.0000 g | Freq: Three times a day (TID) | INTRAVENOUS | Status: AC
  Administered 2021-03-27 – 2021-03-28 (×2): 2 g via INTRAVENOUS
  Filled 2021-03-27 (×2): qty 100

## 2021-03-27 MED ORDER — METHOCARBAMOL 1000 MG/10ML IJ SOLN
500.0000 mg | Freq: Four times a day (QID) | INTRAVENOUS | Status: DC | PRN
  Filled 2021-03-27: qty 5

## 2021-03-27 MED ORDER — ONDANSETRON HCL 4 MG/2ML IJ SOLN
INTRAMUSCULAR | Status: AC
  Filled 2021-03-27: qty 4

## 2021-03-27 MED ORDER — CHLORHEXIDINE GLUCONATE 0.12 % MT SOLN
OROMUCOSAL | Status: AC
  Administered 2021-03-27: 15 mL via OROMUCOSAL
  Filled 2021-03-27: qty 15

## 2021-03-27 MED ORDER — CHLORHEXIDINE GLUCONATE CLOTH 2 % EX PADS: 6.0000 | MEDICATED_PAD | Freq: Once | CUTANEOUS | Status: DC

## 2021-03-27 MED ORDER — OXYCODONE-ACETAMINOPHEN 5-325 MG PO TABS
1.0000 | ORAL_TABLET | ORAL | Status: DC | PRN
  Administered 2021-03-27: 2 via ORAL
  Administered 2021-03-28: 1 via ORAL
  Administered 2021-03-28: 2 via ORAL
  Administered 2021-03-28 (×2): 1 via ORAL
  Administered 2021-03-29: 2 via ORAL
  Administered 2021-03-29 (×2): 1 via ORAL
  Administered 2021-03-29: 2 via ORAL
  Administered 2021-03-30 – 2021-04-03 (×11): 1 via ORAL
  Filled 2021-03-27 (×2): qty 1
  Filled 2021-03-27: qty 2
  Filled 2021-03-27: qty 1
  Filled 2021-03-27: qty 2
  Filled 2021-03-27 (×3): qty 1
  Filled 2021-03-27: qty 2
  Filled 2021-03-27 (×2): qty 1
  Filled 2021-03-27: qty 2
  Filled 2021-03-27 (×10): qty 1

## 2021-03-27 MED ORDER — CEFAZOLIN SODIUM-DEXTROSE 2-4 GM/100ML-% IV SOLN
2.0000 g | INTRAVENOUS | Status: AC
  Administered 2021-03-27: 2 g via INTRAVENOUS

## 2021-03-27 MED ORDER — ROCURONIUM BROMIDE 10 MG/ML (PF) SYRINGE
PREFILLED_SYRINGE | INTRAVENOUS | Status: AC
  Filled 2021-03-27: qty 30

## 2021-03-27 MED ORDER — LIDOCAINE-EPINEPHRINE 1 %-1:100000 IJ SOLN
INTRAMUSCULAR | Status: AC
  Filled 2021-03-27: qty 1

## 2021-03-27 MED ORDER — CEFAZOLIN SODIUM-DEXTROSE 2-4 GM/100ML-% IV SOLN
INTRAVENOUS | Status: AC
  Filled 2021-03-27: qty 100

## 2021-03-27 MED ORDER — SODIUM CHLORIDE 0.9 % IV SOLN: 250.0000 mL | INTRAVENOUS | Status: DC

## 2021-03-27 MED ORDER — LIDOCAINE 2% (20 MG/ML) 5 ML SYRINGE
INTRAMUSCULAR | Status: AC
  Filled 2021-03-27: qty 10

## 2021-03-27 MED ORDER — FENTANYL CITRATE (PF) 100 MCG/2ML IJ SOLN: 25.0000 ug | INTRAMUSCULAR | Status: DC | PRN

## 2021-03-27 MED ORDER — DEXAMETHASONE SODIUM PHOSPHATE 10 MG/ML IJ SOLN
INTRAMUSCULAR | Status: AC
  Filled 2021-03-27: qty 2

## 2021-03-27 MED ORDER — ALUM & MAG HYDROXIDE-SIMETH 200-200-20 MG/5ML PO SUSP: 30.0000 mL | Freq: Four times a day (QID) | ORAL | Status: DC | PRN

## 2021-03-27 MED ORDER — ONDANSETRON HCL 4 MG/2ML IJ SOLN
4.0000 mg | Freq: Four times a day (QID) | INTRAMUSCULAR | Status: AC | PRN
Start: 2021-03-27 — End: 2021-03-27
  Administered 2021-03-27: 4 mg via INTRAVENOUS

## 2021-03-27 MED ORDER — OXYCODONE HCL 5 MG/5ML PO SOLN
5.0000 mg | Freq: Once | ORAL | Status: DC | PRN
Start: 2021-03-27 — End: 2021-03-27

## 2021-03-27 MED ORDER — ONDANSETRON HCL 4 MG/2ML IJ SOLN: 4.0000 mg | Freq: Four times a day (QID) | INTRAMUSCULAR | Status: DC | PRN

## 2021-03-27 MED ORDER — POLYETHYLENE GLYCOL 3350 17 G PO PACK
17.0000 g | PACK | Freq: Every day | ORAL | Status: DC | PRN
  Administered 2021-03-30: 17 g via ORAL
  Filled 2021-03-27: qty 1

## 2021-03-27 MED ORDER — PROPOFOL 10 MG/ML IV BOLUS
INTRAVENOUS | Status: DC | PRN
  Administered 2021-03-27: 100 mg via INTRAVENOUS

## 2021-03-27 MED ORDER — SUGAMMADEX SODIUM 200 MG/2ML IV SOLN
INTRAVENOUS | Status: DC | PRN
Start: 2021-03-27 — End: 2021-03-27
  Administered 2021-03-27 (×2): 200 mg via INTRAVENOUS

## 2021-03-27 MED ORDER — BUPIVACAINE HCL (PF) 0.5 % IJ SOLN
INTRAMUSCULAR | Status: AC
  Filled 2021-03-27: qty 30

## 2021-03-27 MED ORDER — PHENYLEPHRINE 40 MCG/ML (10ML) SYRINGE FOR IV PUSH (FOR BLOOD PRESSURE SUPPORT)
PREFILLED_SYRINGE | INTRAVENOUS | Status: DC | PRN
  Administered 2021-03-27 (×3): 80 ug via INTRAVENOUS
  Administered 2021-03-27: 40 ug via INTRAVENOUS

## 2021-03-27 MED ORDER — FENTANYL CITRATE (PF) 250 MCG/5ML IJ SOLN
INTRAMUSCULAR | Status: AC
  Filled 2021-03-27: qty 5

## 2021-03-27 MED ORDER — LIDOCAINE-EPINEPHRINE 1 %-1:100000 IJ SOLN
INTRAMUSCULAR | Status: DC | PRN
  Administered 2021-03-27: 5 mL

## 2021-03-27 MED ORDER — GLYCOPYRROLATE 0.2 MG/ML IJ SOLN
INTRAMUSCULAR | Status: DC | PRN
  Administered 2021-03-27: .2 mg via INTRAVENOUS

## 2021-03-27 MED ORDER — ONDANSETRON HCL 4 MG/2ML IJ SOLN
INTRAMUSCULAR | Status: DC | PRN
  Administered 2021-03-27: 4 mg via INTRAVENOUS

## 2021-03-27 MED ORDER — THROMBIN 5000 UNITS EX SOLR
OROMUCOSAL | Status: DC | PRN
  Administered 2021-03-27 (×2): 5 mL via TOPICAL

## 2021-03-27 MED ORDER — OXYCODONE HCL 5 MG PO TABS: 5.0000 mg | ORAL_TABLET | Freq: Once | ORAL | Status: DC | PRN

## 2021-03-27 MED ORDER — FENTANYL CITRATE (PF) 250 MCG/5ML IJ SOLN
INTRAMUSCULAR | Status: DC | PRN
  Administered 2021-03-27 (×5): 50 ug via INTRAVENOUS
  Administered 2021-03-27: 100 ug via INTRAVENOUS

## 2021-03-27 MED ORDER — ACETAMINOPHEN 325 MG PO TABS
650.0000 mg | ORAL_TABLET | ORAL | Status: DC | PRN
  Administered 2021-03-30: 650 mg via ORAL
  Filled 2021-03-27: qty 2

## 2021-03-27 MED ORDER — SODIUM CHLORIDE 0.9% FLUSH: 3.0000 mL | INTRAVENOUS | Status: DC | PRN

## 2021-03-27 MED ORDER — DOCUSATE SODIUM 100 MG PO CAPS
100.0000 mg | ORAL_CAPSULE | Freq: Two times a day (BID) | ORAL | Status: DC
  Administered 2021-03-27 – 2021-04-03 (×9): 100 mg via ORAL
  Filled 2021-03-27 (×11): qty 1

## 2021-03-27 MED ORDER — ONDANSETRON HCL 4 MG PO TABS: 4.0000 mg | ORAL_TABLET | Freq: Four times a day (QID) | ORAL | Status: DC | PRN

## 2021-03-27 MED ORDER — ORAL CARE MOUTH RINSE: 15.0000 mL | Freq: Once | OROMUCOSAL | Status: AC

## 2021-03-27 MED ORDER — BUPIVACAINE HCL (PF) 0.5 % IJ SOLN
INTRAMUSCULAR | Status: DC | PRN
  Administered 2021-03-27: 5 mL
  Administered 2021-03-27: 25 mL

## 2021-03-27 MED ORDER — BISACODYL 10 MG RE SUPP: 10.0000 mg | Freq: Every day | RECTAL | Status: DC | PRN

## 2021-03-27 MED ORDER — PANTOPRAZOLE SODIUM 40 MG PO TBEC
40.0000 mg | DELAYED_RELEASE_TABLET | Freq: Every day | ORAL | Status: DC
  Administered 2021-03-27 – 2021-04-03 (×8): 40 mg via ORAL
  Filled 2021-03-27 (×8): qty 1

## 2021-03-27 MED ORDER — TRIAMTERENE-HCTZ 37.5-25 MG PO CAPS: 1.0000 | ORAL_CAPSULE | Freq: Every day | ORAL | Status: DC | PRN

## 2021-03-27 MED ORDER — METHOCARBAMOL 500 MG PO TABS
500.0000 mg | ORAL_TABLET | Freq: Four times a day (QID) | ORAL | Status: DC | PRN
  Administered 2021-03-27 – 2021-04-03 (×8): 500 mg via ORAL
  Filled 2021-03-27 (×9): qty 1

## 2021-03-27 MED ORDER — EPHEDRINE 5 MG/ML INJ
INTRAVENOUS | Status: AC
  Filled 2021-03-27: qty 5

## 2021-03-27 MED ORDER — ATORVASTATIN CALCIUM 40 MG PO TABS
40.0000 mg | ORAL_TABLET | Freq: Every day | ORAL | Status: DC
  Administered 2021-03-28 – 2021-04-03 (×7): 40 mg via ORAL
  Filled 2021-03-27 (×7): qty 1

## 2021-03-27 MED ORDER — THROMBIN 5000 UNITS EX SOLR
CUTANEOUS | Status: AC
  Filled 2021-03-27: qty 5000

## 2021-03-27 MED ORDER — ACETAMINOPHEN 650 MG RE SUPP: 650.0000 mg | RECTAL | Status: DC | PRN

## 2021-03-27 MED ORDER — FLEET ENEMA 7-19 GM/118ML RE ENEM: 1.0000 | ENEMA | Freq: Once | RECTAL | Status: DC | PRN

## 2021-03-27 MED ORDER — PHENYLEPHRINE HCL-NACL 20-0.9 MG/250ML-% IV SOLN
INTRAVENOUS | Status: DC | PRN
  Administered 2021-03-27: 25 ug/min via INTRAVENOUS

## 2021-03-27 MED ORDER — PHENOL 1.4 % MT LIQD: 1.0000 | OROMUCOSAL | Status: DC | PRN

## 2021-03-27 MED ORDER — PROPOFOL 10 MG/ML IV BOLUS
INTRAVENOUS | Status: AC
  Filled 2021-03-27: qty 20

## 2021-03-27 MED ORDER — 0.9 % SODIUM CHLORIDE (POUR BTL) OPTIME
TOPICAL | Status: DC | PRN
  Administered 2021-03-27: 1000 mL

## 2021-03-27 MED ORDER — ROCURONIUM BROMIDE 10 MG/ML (PF) SYRINGE
PREFILLED_SYRINGE | INTRAVENOUS | Status: DC | PRN
  Administered 2021-03-27: 30 mg via INTRAVENOUS
  Administered 2021-03-27: 20 mg via INTRAVENOUS
  Administered 2021-03-27: 50 mg via INTRAVENOUS

## 2021-03-27 MED ORDER — MENTHOL 3 MG MT LOZG: 1.0000 | LOZENGE | OROMUCOSAL | Status: DC | PRN

## 2021-03-27 MED ORDER — DEXAMETHASONE SODIUM PHOSPHATE 10 MG/ML IJ SOLN
INTRAMUSCULAR | Status: DC | PRN
  Administered 2021-03-27: 10 mg via INTRAVENOUS

## 2021-03-27 MED ORDER — SODIUM CHLORIDE 0.9% FLUSH: 3.0000 mL | Freq: Two times a day (BID) | INTRAVENOUS | Status: DC

## 2021-03-27 MED ORDER — LIDOCAINE 2% (20 MG/ML) 5 ML SYRINGE
INTRAMUSCULAR | Status: DC | PRN
  Administered 2021-03-27: 40 mg via INTRAVENOUS
  Administered 2021-03-27: 60 mg via INTRAVENOUS

## 2021-03-27 MED ORDER — TRIAMTERENE-HCTZ 37.5-25 MG PO TABS
1.0000 | ORAL_TABLET | Freq: Every day | ORAL | Status: DC | PRN
  Administered 2021-03-29: 1 via ORAL
  Filled 2021-03-27 (×2): qty 1

## 2021-03-27 MED ORDER — SENNA 8.6 MG PO TABS
1.0000 | ORAL_TABLET | Freq: Two times a day (BID) | ORAL | Status: DC
  Administered 2021-03-27 – 2021-04-03 (×8): 8.6 mg via ORAL
  Filled 2021-03-27 (×11): qty 1

## 2021-03-27 MED ORDER — FLUTICASONE PROPIONATE 50 MCG/ACT NA SUSP
1.0000 | Freq: Two times a day (BID) | NASAL | Status: DC | PRN
  Filled 2021-03-27: qty 16

## 2021-03-27 MED ORDER — CHLORHEXIDINE GLUCONATE 0.12 % MT SOLN: 15.0000 mL | Freq: Once | OROMUCOSAL | Status: AC

## 2021-03-27 MED ORDER — MORPHINE SULFATE (PF) 2 MG/ML IV SOLN: 2.0000 mg | INTRAVENOUS | Status: DC | PRN

## 2021-03-27 SURGICAL SUPPLY — 71 items
ADH SKN CLS APL DERMABOND .7 (GAUZE/BANDAGES/DRESSINGS) ×1
APL SRG 60D 8 XTD TIP BNDBL (TIP)
BAG COUNTER SPONGE SURGICOUNT (BAG) ×3 IMPLANT
BAG SPNG CNTER NS LX DISP (BAG) ×2
BASKET BONE COLLECTION (BASKET) ×2 IMPLANT
BLADE CLIPPER SURG (BLADE) IMPLANT
BONE CANC CHIPS 20CC PCAN1/4 (Bone Implant) ×2 IMPLANT
BUR MATCHSTICK NEURO 3.0 LAGG (BURR) ×2 IMPLANT
CAGE TLIF MOD 7X10X25 4D (Cage) ×1 IMPLANT
CANISTER SUCT 3000ML PPV (MISCELLANEOUS) ×2 IMPLANT
CEMENT KYPHON C01A KIT/MIXER (Cement) ×1 IMPLANT
CHIPS CANC BONE 20CC PCAN1/4 (Bone Implant) ×1 IMPLANT
CNTNR URN SCR LID CUP LEK RST (MISCELLANEOUS) ×1 IMPLANT
CONT SPEC 4OZ STRL OR WHT (MISCELLANEOUS) ×2
COVER BACK TABLE 60X90IN (DRAPES) ×2 IMPLANT
DECANTER SPIKE VIAL GLASS SM (MISCELLANEOUS) ×1 IMPLANT
DERMABOND ADVANCED (GAUZE/BANDAGES/DRESSINGS) ×1
DERMABOND ADVANCED .7 DNX12 (GAUZE/BANDAGES/DRESSINGS) ×1 IMPLANT
DEVICE DISSECT PLASMABLAD 3.0S (MISCELLANEOUS) ×1 IMPLANT
DRAPE C-ARM 42X72 X-RAY (DRAPES) ×3 IMPLANT
DRAPE C-ARMOR (DRAPES) ×1 IMPLANT
DRAPE HALF SHEET 40X57 (DRAPES) IMPLANT
DRAPE LAPAROTOMY 100X72X124 (DRAPES) ×2 IMPLANT
DRAPE WARM FLUID 44X44 (DRAPES) ×1 IMPLANT
DRSG OPSITE POSTOP 4X6 (GAUZE/BANDAGES/DRESSINGS) ×1 IMPLANT
DURAPREP 26ML APPLICATOR (WOUND CARE) ×2 IMPLANT
DURASEAL APPLICATOR TIP (TIP) IMPLANT
DURASEAL SPINE SEALANT 3ML (MISCELLANEOUS) IMPLANT
ELECT REM PT RETURN 9FT ADLT (ELECTROSURGICAL) ×2
ELECTRODE REM PT RTRN 9FT ADLT (ELECTROSURGICAL) ×1 IMPLANT
GAUZE 4X4 16PLY ~~LOC~~+RFID DBL (SPONGE) ×1 IMPLANT
GAUZE SPONGE 4X4 12PLY STRL (GAUZE/BANDAGES/DRESSINGS) ×2 IMPLANT
GLOVE SURG LTX SZ8.5 (GLOVE) ×4 IMPLANT
GLOVE SURG UNDER POLY LF SZ8.5 (GLOVE) ×4 IMPLANT
GOWN STRL REUS W/ TWL LRG LVL3 (GOWN DISPOSABLE) IMPLANT
GOWN STRL REUS W/ TWL XL LVL3 (GOWN DISPOSABLE) IMPLANT
GOWN STRL REUS W/TWL 2XL LVL3 (GOWN DISPOSABLE) ×4 IMPLANT
GOWN STRL REUS W/TWL LRG LVL3 (GOWN DISPOSABLE)
GOWN STRL REUS W/TWL XL LVL3 (GOWN DISPOSABLE)
GRAFT BNE CANC CHIPS 1-8 20CC (Bone Implant) IMPLANT
GRAFT BONE PROTEIOS LRG 5CC (Orthopedic Implant) ×1 IMPLANT
HEMOSTAT POWDER KIT SURGIFOAM (HEMOSTASIS) ×2 IMPLANT
KIT BASIN OR (CUSTOM PROCEDURE TRAY) ×2 IMPLANT
KIT GRAFTMAG DEL NEURO DISP (NEUROSURGERY SUPPLIES) ×2 IMPLANT
KIT TURNOVER KIT B (KITS) ×2 IMPLANT
MILL MEDIUM DISP (BLADE) ×2 IMPLANT
NDL RELINE-OR FENS 16 (NEEDLE) IMPLANT
NEEDLE HYPO 22GX1.5 SAFETY (NEEDLE) ×2 IMPLANT
NEEDLE RELINE-OR FENS 16 (NEEDLE) ×2 IMPLANT
NS IRRIG 1000ML POUR BTL (IV SOLUTION) ×3 IMPLANT
PACK LAMINECTOMY NEURO (CUSTOM PROCEDURE TRAY) ×2 IMPLANT
PAD ARMBOARD 7.5X6 YLW CONV (MISCELLANEOUS) ×6 IMPLANT
PATTIES SURGICAL .5 X1 (DISPOSABLE) ×2 IMPLANT
PLASMABLADE 3.0S (MISCELLANEOUS) ×2
PUSHER RELINE FENS 36 OR (MISCELLANEOUS) ×1 IMPLANT
ROD RELINE LORDOTIC 5.5X45 (Rod) ×1 IMPLANT
SCREW RELINE PA 6.5X45 (Screw) ×2 IMPLANT
SPONGE SURGIFOAM ABS GEL 100 (HEMOSTASIS) ×1 IMPLANT
SPONGE T-LAP 4X18 ~~LOC~~+RFID (SPONGE) ×1 IMPLANT
SUT PROLENE 6 0 BV (SUTURE) IMPLANT
SUT VIC AB 1 CT1 18XBRD ANBCTR (SUTURE) ×1 IMPLANT
SUT VIC AB 1 CT1 8-18 (SUTURE) ×4
SUT VIC AB 2-0 CP2 18 (SUTURE) ×3 IMPLANT
SUT VIC AB 3-0 SH 8-18 (SUTURE) ×2 IMPLANT
SUT VIC AB 4-0 RB1 18 (SUTURE) ×2 IMPLANT
SYR 3ML LL SCALE MARK (SYRINGE) ×8 IMPLANT
TIP FENS REPLACE RELINE (MISCELLANEOUS) ×1 IMPLANT
TOWEL GREEN STERILE (TOWEL DISPOSABLE) ×2 IMPLANT
TOWEL GREEN STERILE FF (TOWEL DISPOSABLE) ×2 IMPLANT
TRAY FOLEY MTR SLVR 16FR STAT (SET/KITS/TRAYS/PACK) ×2 IMPLANT
WATER STERILE IRR 1000ML POUR (IV SOLUTION) ×2 IMPLANT

## 2021-03-27 NOTE — Anesthesia Procedure Notes (Signed)
Procedure Name: Intubation Date/Time: 03/27/2021 3:43 PM Performed by: Mariea Clonts, CRNA Pre-anesthesia Checklist: Patient identified, Emergency Drugs available, Suction available and Patient being monitored Patient Re-evaluated:Patient Re-evaluated prior to induction Oxygen Delivery Method: Circle System Utilized Preoxygenation: Pre-oxygenation with 100% oxygen Induction Type: IV induction Ventilation: Mask ventilation without difficulty Laryngoscope Size: Miller and 2 Grade View: Grade I Tube type: Oral Tube size: 7.0 mm Number of attempts: 1 Airway Equipment and Method: Stylet and Oral airway Placement Confirmation: ETT inserted through vocal cords under direct vision, positive ETCO2 and breath sounds checked- equal and bilateral Tube secured with: Tape Dental Injury: Teeth and Oropharynx as per pre-operative assessment

## 2021-03-27 NOTE — Progress Notes (Signed)
Pacu RN Report to floor given  Gave report to [employer], rm 3C10. Discussed surgery, meds given in OR and Pacu, VS, IV fluids given, EBL, urine output, pain and other pertinent information. Also discussed if pt had any family or friends here or belongings with them.   Pt exits my care.

## 2021-03-27 NOTE — Progress Notes (Signed)
Patient ID: Amanda Hanna, female   DOB: 1940/07/31, 80 y.o.   MRN: [id number] Vital signs are stable Motor function is good Patient tolerated surgery well

## 2021-03-27 NOTE — H&P (Signed)
Amanda Hanna is an 80 y.o. female.   Chief Complaint: Back left lower extremity pain herniated nucleus pulposus L2-3 left HPI: Amanda Hanna is a 80 year old individual who is developed severe left lower extremity pain on top of chronic back pain that she is experienced for a number of years.  She has advanced spondylitic disease at multiple levels but now at L2-3 she has a large extruded fragment of disc on the left side.  There is advanced spondylitic changes with marked facet overgrowth also at that level.  Patient has been advised regarding surgery to decompress and stabilize the joint.  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 She has never used smokeless tobacco. She reports that she does not drink alcohol and does not use drugs.  Allergies:  Allergies  Allergen Reactions   Boniva [Ibandronic Acid] Other (See Comments)    Pt took it several years ago and states ":It made me very sore all over"   Sulfa Antibiotics Swelling    On face    Medications Prior to Admission  Medication Sig Dispense Refill   acetaminophen (TYLENOL) 500 MG tablet Take 1,000 mg by mouth 3 (three) times daily.     atorvastatin (LIPITOR) 40 MG tablet Take 40 mg by mouth daily.     Cholecalciferol (DIALYVITE VITAMIN D 5000) 125 MCG (5000 UT) capsule Take 5,000 Units by mouth daily.     fluticasone (FLONASE) 50 MCG/ACT nasal spray Place 1 spray into both nostrils 2 (two)  times daily as needed for allergies.     nystatin (MYCOSTATIN/NYSTOP) powder Apply 1 application topically 2 (two) times daily as needed for rash.     triamterene-hydrochlorothiazide (DYAZIDE) 37.5-25 MG per capsule Take 1 capsule by mouth daily as needed (swelling).     calcium carbonate (TUMS - DOSED IN MG ELEMENTAL CALCIUM) 500 MG chewable tablet Chew 1 tablet by mouth 2 (two) times daily as needed for indigestion.     omeprazole (PRILOSEC) 40 MG capsule Take 40 mg by mouth daily as needed (acid reflux).      Results for orders placed or performed during the hospital encounter of 03/27/21 (from the past 48 hour(s))  ABO/Rh     Status: None   Collection Time: 03/27/21 12:35 PM  Result Value Ref Range   ABO/RH(D)      A NEG Performed at [facility] [address] [place], [place] [address]    No results found.  Review of Systems  Constitutional:  Positive for activity change.  Musculoskeletal:  Positive for back pain, gait problem and myalgias.  Neurological:  Positive for weakness and numbness.  All other systems reviewed and are negative.  Blood pressure (!) 148/82, pulse 97, temperature 98.1 F (36.7  C), temperature source Oral, resp. rate 17, SpO2 99 %. Physical Exam Constitutional:      Appearance: Normal appearance. She is normal weight.  HENT:     Head: Normocephalic and atraumatic.     Right Ear: Tympanic membrane normal.     Left Ear: Tympanic membrane normal.     Nose: Nose normal.     Mouth/Throat:     Mouth: Mucous membranes are moist.  Eyes:     Extraocular Movements: Extraocular movements intact.     Pupils: Pupils are equal, round, and reactive to light.  Cardiovascular:     Rate and Rhythm: Normal rate and regular rhythm.     Pulses: Normal pulses.     Heart sounds: Normal heart sounds.  Pulmonary:     Effort: Pulmonary effort is normal.     Breath sounds: Normal breath sounds.  Abdominal:     General: Abdomen is flat.  Musculoskeletal:      Cervical back: Normal range of motion.     Comments: Positive straight leg raising at 45 degrees in either lower extremities.  Skin:    General: Skin is warm and dry.  Neurological:     Mental Status: She is alert.     Comments: Marked weakness in the iliopsoas and quadricep on the left side at 4- out of 5 in the quadricep and 3 out of 5 in the iliopsoas.  Right-sided strength is intact deep tendon reflexes are absent in the patella and the Achilles on the left trace in the Achilles on the right absent in the patellae on the right.  Sensation is diminished on anterior lateral border of the left leg.  Straight leg raising is positive on the left side at 15 degrees positive on right side at 30 degrees for left leg pain.  Patrick's maneuver is negative bilaterally.  Cranial nerve examination is within the limits of normal upper extremity strength is normal.  Psychiatric:        Mood and Affect: Mood normal.        Behavior: Behavior normal.        Thought Content: Thought content normal.        Judgment: Judgment normal.     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  Plan: Transforaminal decompression of L2-L3 with TLIF arthrodesis using allograft and autograft and pedicle screw fixation L2-3  Earleen Newport, MD 03/27/2021, 3:00 PM

## 2021-03-27 NOTE — Progress Notes (Signed)
Orthopedic Tech Progress Note Patient Details:  Amanda Hanna 12-Apr-1941 [id number]  Ortho Devices Type of Ortho Device: Lumbar corsett Ortho Device/Splint Location: Lower back Ortho Device/Splint Interventions: Ordered     Dropped LSO off with nurse.  Vernona Rieger 03/27/2021, 6:59 PM

## 2021-03-27 NOTE — Transfer of Care (Signed)
Immediate Anesthesia Transfer of Care Note  Patient: Amanda Hanna  Procedure(s) Performed: Left Lumbar Two-Three Transforaminal lumbar interbody fusion (Left)  Patient Location: PACU  Anesthesia Type:General  Level of Consciousness: awake and alert   Airway & Oxygen Therapy: Patient Spontanous Breathing and Patient connected to face mask oxygen  Post-op Assessment: Report given to RN, Post -op Vital signs reviewed and stable and Patient moving all extremities X 4  Post vital signs: Reviewed and stable  Last Vitals:  Vitals Value Taken Time  BP 144/89 03/27/21 1816  Temp    Pulse 93 03/27/21 1822  Resp 17 03/27/21 1822  SpO2 95 % 03/27/21 1822  Vitals shown include unvalidated device data.  Last Pain:  Vitals:   03/27/21 1251  TempSrc:   PainSc: 0-No pain         Complications: No notable events documented.

## 2021-03-27 NOTE — Anesthesia Preprocedure Evaluation (Addendum)
Anesthesia Evaluation  Patient identified by MRN, date of birth, ID band Patient awake    Reviewed: Allergy & Precautions, NPO status , Patient's Chart, lab work & pertinent test results  Airway Mallampati: II  TM Distance: >3 FB Neck ROM: Full    Dental  (+) Teeth Intact, Dental Advisory Given   Pulmonary    breath sounds clear to auscultation       Cardiovascular negative cardio ROS   Rhythm:Regular Rate:Normal     Neuro/Psych PSYCHIATRIC DISORDERS Depression    GI/Hepatic negative GI ROS, Neg liver ROS,   Endo/Other  negative endocrine ROS  Renal/GU negative Renal ROS     Musculoskeletal negative musculoskeletal ROS (+)   Abdominal Normal abdominal exam  (+)   Peds  Hematology negative hematology ROS (+)   Anesthesia Other Findings   Reproductive/Obstetrics                            Anesthesia Physical Anesthesia Plan  ASA: 2  Anesthesia Plan: General   Post-op Pain Management:    Induction: Intravenous  PONV Risk Score and Plan: 4 or greater and Ondansetron, Dexamethasone and Treatment may vary due to age or medical condition  Airway Management Planned: Oral ETT  Additional Equipment: None  Intra-op Plan:   Post-operative Plan: Extubation in OR  Informed Consent: I have reviewed the patients History and Physical, chart, labs and discussed the procedure including the risks, benefits and alternatives for the proposed anesthesia with the patient or authorized representative who has indicated his/her understanding and acceptance.     Dental advisory given  Plan Discussed with: CRNA  Anesthesia Plan Comments:        Anesthesia Quick Evaluation

## 2021-03-27 NOTE — Op Note (Signed)
Date of surgery: 03/27/2021 Preoperative diagnosis: Herniated nucleus pulposus L2-3 left with left lumbar radiculopathy, spondylosis with retrolisthesis L2-3 Postoperative diagnosis: Same Procedure: Hemilaminectomy L2-L3 with discectomy which includes more work than required for simple interbody technique.  Transforaminal interbody decompression and fusion using singular transforaminal spacer local autograft allograft and Proteus unilateral pedicle screw fixation with augmented pedicle screws at L2 and L3. Surgeon: Kristeen Miss, MD Anesthesia: General endotracheal Indications: Amanda Hanna is a 80 year old individual whose had significant back and left lower extremity pain with a large extruded fragment of disc at L2-3 creating severe stenosis at that level she has had left buttock and left leg pain and weakness in the tibialis anterior group.  She is advised regarding need for surgery to decompress and stabilize the L2-3 level.  Procedure: The patient was brought to the operating room supine on the stretcher.  After the smooth induction of general endotracheal anesthesia, she was carefully turned prone.  The back was prepped with alcohol DuraPrep and draped in a sterile fashion.  Midline incision was created in the lower lumbar spine and carried down to the lumbodorsal fascia and the needle was placed in the L2-3 space and identified this area.  Dissection was then taken out subperiosteally to expose the interlaminar space at L2-L3 and the facet joint at L2 and the transverse process of L2 and L3 these areas were packed off for later use and grafting.  Then a laminectomy was created removing the inferior margin lamina of L2 out to and including the entirety of the facet.  This bone was saved and used for autograft later.  Then the common dural tube was cleared and the path underneath the area was dissected carefully and epidural veins were cauterized and divided.  There was noted to be a substantial mass  compressing the dura dorsally.  This was carefully resected and the mass was entered and found to be a large disc herniation.  Disc fragments were removed in a piecemeal fashion and ultimately the common dural tube was well decompressed as was the path of the L3 nerve root inferiorly.  Once total discectomy was started in the disc space several other fragments of disc were removed from within the disc space and then the disc space could be opened further with a interlaminar spreader to allow curettage of the endplates so that a grafting surface for arthrodesis was provided.  Once the disc base was evacuated as best possible from this left-sided approach the space was sized for an appropriate size spacer it was felt that a 7 x 10 x 25 mm spacer with 4 degrees of lordosis would fit best into this interval this was a modulus TLIF spacer that was filled with autograft allograft and Proteus.  Approximately 15 cc of bone graft was then fit into the interspace along with the spacer.  Pedicle entry sites were then chosen at L2 and L3 and 6.5 x 45 mm cannulated screws were placed in this region.  The cannulated screws were such that cement could be passed out the screws to allow for pedicular augmentation.  This was done with a cc of methacrylate which was injected into the screw heads via the applied tower on the screw head.  Radiographic confirmation of placement of the glue was obtained.  Then a 45 mm precontoured rod was used to connect the 2 screw heads together.  This was done in a neutral construct lateral gutters were then packed with the remainder of 12 cc of bone graft  and once this was accomplished hemostasis in the soft tissues was checked path the L2 and L3 nerve roots were checked further patency, dural tube was noted to be well decompressed and with this the lumbodorsal fascia was reapproximated with #1 Vicryl 25 cc of half percent Marcaine was injected into the paraspinous fascia and tissues.  2-0 Vicryl was  used in subcutaneous tissues 3-0 Vicryl subcuticularly.  Dermabond was placed on the skin blood loss was estimated at 150 cc.

## 2021-03-28 ENCOUNTER — Encounter (HOSPITAL_COMMUNITY): Payer: Self-pay | Admitting: Neurological Surgery

## 2021-03-28 LAB — BASIC METABOLIC PANEL
Anion gap: 7 (ref 5–15)
BUN: 7 mg/dL — ABNORMAL LOW (ref 8–23)
CO2: 25 mmol/L (ref 22–32)
Calcium: 8.4 mg/dL — ABNORMAL LOW (ref 8.9–10.3)
Chloride: 106 mmol/L (ref 98–111)
Creatinine, Ser: 0.82 mg/dL (ref 0.44–1.00)
GFR, Estimated: >60 mL/min (ref >60)
Glucose, Bld: 157 mg/dL — ABNORMAL HIGH (ref 70–99)
Potassium: 4.7 mmol/L (ref 3.5–5.1)
Sodium: 138 mmol/L (ref 135–145)

## 2021-03-28 LAB — CBC
HCT: 39.6 % (ref 36.0–46.0)
Hemoglobin: 13.5 g/dL (ref 12.0–15.0)
MCH: 32.3 pg (ref 26.0–34.0)
MCHC: 34.1 g/dL (ref 30.0–36.0)
MCV: 94.7 fL (ref 80.0–100.0)
Platelets: 269 10*3/uL (ref 150–400)
RBC: 4.18 MIL/uL (ref 3.87–5.11)
RDW: 12.2 % (ref 11.5–15.5)
WBC: 14.2 10*3/uL — ABNORMAL HIGH (ref 4.0–10.5)
nRBC: 0 % (ref 0.0–0.2)

## 2021-03-28 MED ORDER — OXYCODONE-ACETAMINOPHEN 5-325 MG PO TABS: 1.0000 | ORAL_TABLET | ORAL | 0 refills | Status: DC | PRN

## 2021-03-28 MED ORDER — METHOCARBAMOL 500 MG PO TABS: 500.0000 mg | ORAL_TABLET | Freq: Four times a day (QID) | ORAL | 3 refills | Status: AC | PRN

## 2021-03-28 MED FILL — Thrombin For Soln 5000 Unit: CUTANEOUS | Qty: 5000 | Status: AC

## 2021-03-28 NOTE — Progress Notes (Signed)
Patient ID: Amanda Hanna, female   DOB: 11/16/1940, 80 y.o.   MRN: [id number] Alert and oriented Complains of some soreness and weakness in the left leg not much different than what was noted preop Incision is clean and dry on the back Will mobilize today with PT Hopeful for early discharge home if stable on feet

## 2021-03-28 NOTE — Progress Notes (Signed)
Patient ID: Amanda Hanna, female   DOB: 1940-07-17, 80 y.o.   MRN: [id number] Vital signs are stable Incision is clean and dry Patient is mobilizing well Plan discharge for the morning.

## 2021-03-28 NOTE — Anesthesia Postprocedure Evaluation (Signed)
Anesthesia Post Note  Patient: Amanda Hanna  Procedure(s) Performed: Left Lumbar Two-Three Transforaminal lumbar interbody fusion (Left)     Patient location during evaluation: Other Anesthesia Type: General Level of consciousness: awake and alert Pain management: pain level controlled Vital Signs Assessment: post-procedure vital signs reviewed and stable Respiratory status: spontaneous breathing, nonlabored ventilation, respiratory function stable and patient connected to nasal cannula oxygen Cardiovascular status: blood pressure returned to baseline and stable Postop Assessment: no apparent nausea or vomiting Anesthetic complications: no   No notable events documented.  Last Vitals:  Vitals:   03/28/21 0419 03/28/21 0805  BP: (!) 147/95 125/71  Pulse: 98 (!) 111  Resp: 18 18  Temp: 36.8 C 37.2 C  SpO2: 92% 93%    Last Pain:  Vitals:   03/28/21 0805  TempSrc: Oral  PainSc:                  Effie Berkshire

## 2021-03-28 NOTE — Discharge Summary (Addendum)
Physician Discharge Summary  Patient ID: Amanda Hanna MRN: [id number] DOB/AGE: 79-31-1942 79 y.o.  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  Consults: None  Significant Diagnostic Studies: None  Treatments: surgery: See op note  Discharge Exam: Blood pressure 137/86, pulse 85, temperature 98.2 F (36.8 C), temperature source Oral, resp. rate 17, SpO2 97 %. Incision/Wound: Incision is clean and dry motor function is intact Station and gait are intact.  Disposition: Discharged to skilled nursing facility for continued inpatient rehabilitation  Discharge Instructions     Call MD for:  redness, tenderness, or signs of infection (pain, swelling, redness, odor or green/yellow discharge around incision site)   Complete by: As directed    Call MD for:  severe uncontrolled pain   Complete by: As directed    Call MD for:  temperature >100.4   Complete by: As directed    Diet - low sodium heart healthy   Complete by: As directed    Diet - low sodium heart healthy   Complete by: As directed    Discharge wound care:   Complete by: As directed    Okay to shower. Do not apply salves or appointments to incision. No heavy lifting with the upper extremities greater than 10 pounds. May resume driving when not requiring pain medication and patient feels comfortable with doing so.   Discharge wound care:   Complete by: As directed    Okay to shower. Do not apply salves or appointments to incision. No heavy lifting with the upper extremities greater than 10 pounds. May resume driving when  not requiring pain medication and patient feels comfortable with doing so.   Face-to-face encounter (required for Medicare/Medicaid patients)   Complete by: As directed    I Patricia Nettle certify that this patient is under my care and that I, or a nurse practitioner or physician's assistant working with me, had a face-to-face encounter that meets the physician face-to-face encounter requirements with this patient on 03/29/2021. The encounter with the patient was in whole, or in part for the following medical condition(s) which is the primary reason for home health care (List medical condition): Lumbar radiculopathy   The encounter with the patient was in whole, or in part, for the following medical condition, which is the primary reason for home health care: lumbar radiculopathy   I certify that, based on my findings, the following services are medically necessary home health services: Physical therapy   Reason for Medically Necessary Home Health Services: Therapy- Therapeutic Exercises to Increase Strength and Endurance   My clinical findings support the need for the above services: Unable to leave home safely without assistance and/or assistive device   Further, I certify that my clinical findings support that this patient is homebound due to: Unable to leave home safely without assistance   Home Health   Complete by: As directed    To provide the following care/treatments:  PT OT     Incentive spirometry RT   Complete by: As directed    Increase activity slowly   Complete by: As directed    Increase activity slowly   Complete by: As directed  Allergies as of 04/01/2021       Reactions   Boniva [ibandronic Acid] Other (See Comments)   Pt took it several years ago and states ":It made me very sore all over"   Sulfa Antibiotics Swelling   On face        Medication List     TAKE these medications    acetaminophen 500 MG tablet Commonly known as: TYLENOL Take 1,000 mg  by mouth 3 (three) times daily.   atorvastatin 40 MG tablet Commonly known as: LIPITOR Take 40 mg by mouth daily.   calcium carbonate 500 MG chewable tablet Commonly known as: TUMS - dosed in mg elemental calcium Chew 1 tablet by mouth 2 (two) times daily as needed for indigestion.   ciprofloxacin 500 MG tablet Commonly known as: CIPRO Take 1 tablet (500 mg total) by mouth 2 (two) times daily.   Dialyvite Vitamin D 5000 125 MCG (5000 UT) capsule Generic drug: Cholecalciferol Take 5,000 Units by mouth daily.   fluticasone 50 MCG/ACT nasal spray Commonly known as: FLONASE Place 1 spray into both nostrils 2 (two) times daily as needed for allergies.   methocarbamol 500 MG tablet Commonly known as: ROBAXIN Take 1 tablet (500 mg total) by mouth every 6 (six) hours as needed for muscle spasms.   nystatin powder Commonly known as: MYCOSTATIN/NYSTOP Apply 1 application topically 2 (two) times daily as needed for rash.   omeprazole 40 MG capsule Commonly known as: PRILOSEC Take 40 mg by mouth daily as needed (acid reflux).   oxyCODONE-acetaminophen 5-325 MG tablet Commonly known as: PERCOCET/ROXICET Take 1-2 tablets by mouth every 4 (four) hours as needed for moderate pain or severe pain.   triamterene-hydrochlorothiazide 37.5-25 MG capsule Commonly known as: DYAZIDE Take 1 capsule by mouth daily as needed (swelling).               Durable Medical Equipment  (From admission, onward)           Start     Ordered   03/28/21 1045  For home use only DME 3 n 1  Once        03/28/21 1044              Discharge Care Instructions  (From admission, onward)           Start     Ordered   04/01/21 0000  Discharge wound care:       Comments: Okay to shower. Do not apply salves or appointments to incision. No heavy lifting with the upper extremities greater than 10 pounds. May resume driving when not requiring pain medication and patient feels comfortable with  doing so.   04/01/21 1843   03/28/21 0000  Discharge wound care:       Comments: Okay to shower. Do not apply salves or appointments to incision. No heavy lifting with the upper extremities greater than 10 pounds. May resume driving when not requiring pain medication and patient feels comfortable with doing so.   03/28/21 1934            Follow-up Information     Kristeen Miss, MD. Call.   Specialty: Neurosurgery Why: As needed, If symptoms worsen Contact information: [address] [place] [address] [phone]         [facility] Follow up.   Specialty: [place] Why: the office will call in about 48 hours to schedule home health visits Contact information: [place]  [address] [place] [address] [phone]                 Signed: Earleen Newport 04/01/2021, 6:57 PM

## 2021-03-28 NOTE — Evaluation (Signed)
Physical Therapy Evaluation Patient Details Name: Amanda Hanna MRN: [id number] DOB: 1940/11/14 Today's Date: 03/28/2021  History of Present Illness  The pt is a 80 yo female presenting 10/13 for hemilaminectomy and discectomy of L2-3 with decompression adn fusion due to chronic pain. PMH includes: depression, osteoporosis, and skin cancer.   Clinical Impression  Pt in bed upon arrival of PT, agreeable to evaluation at this time. Prior to admission the pt was significantly limited in ambulation and mobility by pain, but reports she mobilized with use of 3-wheel walker when she was ambulating in the house. The pt now presents with limitations in functional mobility, strength, power, stability, and endurance due to above dx and chronically reduced mobility prior to surgery, and will continue to benefit from skilled PT to address these deficits. She was able to complete sit-stand with minA and use of RW for balance. She was unable to achieve full stand with L knee extension or hip extension and demos progressively increased bend with fatigue during ambulation. The pt will continue to benefit from skilled PT acutely and following d/c to maximize LE strength and reduce risk of falls at home.         Recommendations for follow up therapy are one component of a multi-disciplinary discharge planning process, led by the attending physician.  Recommendations may be updated based on patient status, additional functional criteria and insurance authorization.  Follow Up Recommendations Home health PT;Supervision for mobility/OOB    Equipment Recommendations   (tub bench)    Recommendations for Other Services       Precautions / Restrictions Precautions Precautions: Back;Fall Precaution Booklet Issued: Yes (comment) Required Braces or Orthoses: Spinal Brace Spinal Brace: Lumbar corset;Applied in sitting position Restrictions Weight Bearing Restrictions: No      Mobility  Bed Mobility Overal  bed mobility: Needs Assistance Bed Mobility: Rolling;Sidelying to Sit Rolling: Min assist Sidelying to sit: Min assist       General bed mobility comments: minA to complete rolling and to elevate trunk from flat bed    Transfers Overall transfer level: Needs assistance Equipment used: Rolling walker (2 wheeled) Transfers: Sit to/from Stand Sit to Stand: Min assist         General transfer comment: minA to power up to standing as well as increased time and cues for positioning/posture  Ambulation/Gait Ambulation/Gait assistance: Min Web designer (Feet): 125 Feet Assistive device: Rolling walker (2 wheeled) Gait Pattern/deviations: Step-to pattern;Decreased step length - right;Decreased stance time - left;Decreased dorsiflexion - left;Decreased weight shift to left Gait velocity: decreased Gait velocity interpretation: <1.31 ft/sec, indicative of household ambulator General Gait Details: pt maintained trunk flexion with LLE flexion and inability to maintain wt on LLE. unable to achieve knee ext or hip ext in standing. progressively worse with continued gait. no instances of buckling or LOB  Stairs Stairs: Yes Stairs assistance: Min assist Stair Management: One rail Left;Step to pattern;Sideways Number of Stairs: 5 General stair comments: side-stepping for BUE support on rail, minA to steady and to maintain precautions         Balance Overall balance assessment: Needs assistance Sitting-balance support: No upper extremity supported;Feet supported Sitting balance-Leahy Scale: Good     Standing balance support: Bilateral upper extremity supported Standing balance-Leahy Scale: Poor Standing balance comment: able to maintain with single UE support, improved with BUE support for gait  Pertinent Vitals/Pain Pain Assessment: Faces Faces Pain Scale: Hurts little more Pain Location: incision site Pain Descriptors / Indicators:  Discomfort;Operative site guarding Pain Intervention(s): Limited activity within patient's tolerance;Monitored during session;Repositioned;Ice applied    Home Living Family/patient expects to be discharged to:: Private residence Living Arrangements: Spouse/significant other Available Help at Discharge: Family;Available 24 hours/day Type of Home: House Home Access: Stairs to enter Entrance Stairs-Rails: Psychiatric nurse of Steps: 5 Home Layout: One level Home Equipment: Grab bars - tub/shower (3-wheel walker)      Prior Function Level of Independence: Needs assistance   Gait / Transfers Assistance Needed: pt using 3-wheel walker but reports reccently not mobilizing much due to pain in standing. only comfortable sitting  ADL's / Homemaking Assistance Needed: taking sit-down baths due to inability to stand, unable to manage IADLs due to poor standing tolerance        Hand Dominance   Dominant Hand: Right    Extremity/Trunk Assessment   Upper Extremity Assessment Upper Extremity Assessment: Defer to OT evaluation    Lower Extremity Assessment Lower Extremity Assessment: Generalized weakness;LLE deficits/detail LLE Deficits / Details: grossly 4-/5 to MMT, pt with poor functional use as she was unable to extend at knee or hip during stance despite cues, progressively increased knee bend with fatigue, no buckling LLE Sensation: WNL LLE Coordination: WNL    Cervical / Trunk Assessment Cervical / Trunk Assessment: Other exceptions Cervical / Trunk Exceptions: s/p lumbar surgery  Communication   Communication: No difficulties  Cognition Arousal/Alertness: Awake/alert Behavior During Therapy: WFL for tasks assessed/performed Overall Cognitive Status: Within Functional Limits for tasks assessed                                 General Comments: pt able to follow all instructions given      General Comments General comments (skin integrity,  edema, etc.): VSS on RA    Exercises     Assessment/Plan    PT Assessment Patient needs continued PT services  PT Problem List Decreased strength;Decreased range of motion;Decreased activity tolerance;Decreased balance;Decreased mobility;Decreased safety awareness;Pain       PT Treatment Interventions Gait training;DME instruction;Stair training;Functional mobility training;Therapeutic activities;Therapeutic exercise;Balance training;Patient/family education    PT Goals (Current goals can be found in the Care Plan section)  Acute Rehab PT Goals Patient Stated Goal: return home PT Goal Formulation: With patient Time For Goal Achievement: 04/11/21 Potential to Achieve Goals: Good    Frequency Min 5X/week    AM-PAC PT "6 Clicks" Mobility  Outcome Measure Help needed turning from your back to your side while in a flat bed without using bedrails?: A Little Help needed moving from lying on your back to sitting on the side of a flat bed without using bedrails?: A Little Help needed moving to and from a bed to a chair (including a wheelchair)?: A Little Help needed standing up from a chair using your arms (e.g., wheelchair or bedside chair)?: A Little Help needed to walk in hospital room?: A Little Help needed climbing 3-5 steps with a railing? : A Little 6 Click Score: 18    End of Session Equipment Utilized During Treatment: Gait belt;Back brace Activity Tolerance: Patient tolerated treatment well Patient left: in chair;with call bell/phone within reach Nurse Communication: Mobility status PT Visit Diagnosis: Other abnormalities of gait and mobility (R26.89);Muscle weakness (generalized) (M62.81);Pain Pain - Right/Left: Left Pain - part of body: Leg  Time: 1415-9733 PT Time Calculation (min) (ACUTE ONLY): 28 min   Charges:   PT Evaluation $PT Eval Low Complexity: 1 Low PT Treatments $Gait Training: 8-22 mins        West Carbo, PT, DPT   Acute Rehabilitation  Department Pager #: [phone]  Sandra Cockayne 03/28/2021, 9:33 AM

## 2021-03-28 NOTE — Evaluation (Signed)
Occupational Therapy Evaluation Patient Details Name: Amanda Hanna MRN: [id number] DOB: Aug 08, 1940 Today's Date: 03/28/2021   History of Present Illness The pt is a 80 yo female presenting 10/13 for hemilaminectomy and discectomy of L2-3 with decompression adn fusion due to chronic pain. PMH includes: depression, osteoporosis, and skin cancer.   Clinical Impression   Patient admitted for the diagnosis above.  PTA she lives with her spouse, who is able to assist.  She appears to have been fairly sedentary due to back issues, but walked with a 3WRW and needed assist with community mobility and IADL.  Deficits are listed below.  Currently she is needing up to Mod A for ADL completion.  OT recommends tub seat, long handled sponge and a toileting aid.  No further acute needs, but HH OT would be beneficial to ensure a safe transition given prior sedentary lifestyle, and mobility restrictions.        Recommendations for follow up therapy are one component of a multi-disciplinary discharge planning process, led by the attending physician.  Recommendations may be updated based on patient status, additional functional criteria and insurance authorization.   Follow Up Recommendations  Home health OT    Equipment Recommendations  Other (comment) (toileting aid and LH Sponge)    Recommendations for Other Services       Precautions / Restrictions Precautions Precautions: Back;Fall Precaution Booklet Issued: Yes (comment) Required Braces or Orthoses: Spinal Brace Spinal Brace: Lumbar corset;Applied in sitting position Restrictions Weight Bearing Restrictions: No      Mobility Bed Mobility Overal bed mobility: Needs Assistance Bed Mobility: Rolling;Sidelying to Sit Rolling: Min assist Sidelying to sit: Min assist       General bed mobility comments: up in the recliner    Transfers Overall transfer level: Needs assistance Equipment used: Rolling walker (2 wheeled) Transfers: Sit  to/from Stand Sit to Stand: Min assist         General transfer comment: minA to power up to standing as well as increased time and cues for positioning/posture    Balance Overall balance assessment: Needs assistance Sitting-balance support: No upper extremity supported;Feet supported Sitting balance-Leahy Scale: Good     Standing balance support: Bilateral upper extremity supported Standing balance-Leahy Scale: Poor Standing balance comment: able to maintain with single UE support, improved with BUE support for gait                           ADL either performed or assessed with clinical judgement   ADL               Lower Body Bathing: Sit to/from stand;Minimal assistance       Lower Body Dressing: Sit to/from stand;Minimal assistance   Toilet Transfer: Min guard;RW   Toileting- Clothing Manipulation and Hygiene: Moderate assistance;Sitting/lateral lean         General ADL Comments: Recommended toileting aid and LH Sponge     Vision Patient Visual Report: No change from baseline       Perception     Praxis      Pertinent Vitals/Pain Pain Assessment: 0-10 Pain Score: 5  Faces Pain Scale: Hurts little more Pain Location: incision site Pain Descriptors / Indicators: Discomfort;Operative site guarding Pain Intervention(s): Monitored during session;Premedicated before session     Hand Dominance Right   Extremity/Trunk Assessment Upper Extremity Assessment Upper Extremity Assessment: Overall WFL for tasks assessed   Lower Extremity Assessment Lower Extremity Assessment: Defer to PT evaluation  Cervical / Trunk Assessment Cervical / Trunk Assessment: Other exceptions Cervical / Trunk Exceptions: s/p lumbar surgery   Communication Communication Communication: No difficulties   Cognition Arousal/Alertness: Awake/alert Behavior During Therapy: WFL for tasks assessed/performed Overall Cognitive Status: Within Functional Limits for  tasks assessed                                   General Comments  VSS on RA    Exercises     Shoulder Instructions      Home Living Family/patient expects to be discharged to:: Private residence Living Arrangements: Spouse/significant other Available Help at Discharge: Family;Available 24 hours/day Type of Home: House Home Access: Stairs to enter [employer] of Steps: 5 Entrance Stairs-Rails: Right;Left Home Layout: One level     Bathroom Shower/Tub: Teacher, early years/pre: Standard     Home Equipment: Grab bars - tub/shower;Adaptive equipment Adaptive Equipment: Reacher Additional Comments: 3WRW      Prior Functioning/Environment Level of Independence: Needs assistance  Gait / Transfers Assistance Needed: pt using 3-wheel walker but reports reccently not mobilizing much due to pain in standing. only comfortable sitting ADL's / Homemaking Assistance Needed: taking sit-down baths due to inability to stand, unable to manage IADLs due to poor standing tolerance            OT Problem List: Impaired balance (sitting and/or standing);Decreased activity tolerance;Decreased strength;Pain      OT Treatment/Interventions:      OT Goals(Current goals can be found in the care plan section) Acute Rehab OT Goals Patient Stated Goal: Hoping to return home later today OT Goal Formulation: With patient Time For Goal Achievement: 03/28/21 Potential to Achieve Goals: Good  OT Frequency:     Barriers to D/C:  Sedentary and mobility problems          Co-evaluation              AM-PAC OT "6 Clicks" Daily Activity     Outcome Measure Help from another person eating meals?: None Help from another person taking care of personal grooming?: None Help from another person toileting, which includes using toliet, bedpan, or urinal?: A Little Help from another person bathing (including washing, rinsing, drying)?: A Little Help from another  person to put on and taking off regular upper body clothing?: A Little Help from another person to put on and taking off regular lower body clothing?: A Little 6 Click Score: 20   End of Session Equipment Utilized During Treatment: Rolling walker;Gait belt;Back brace  Activity Tolerance: Patient limited by fatigue Patient left: in chair;with call bell/phone within reach  OT Visit Diagnosis: Unsteadiness on feet (R26.81);Pain                Time: 8315-1761 OT Time Calculation (min): 17 min Charges:  OT General Charges $OT Visit: 1 Visit OT Evaluation $OT Eval Moderate Complexity: 1 Mod  03/28/2021  RP, OTR/L  Acute Rehabilitation Services  Office:  [place] 03/28/2021, 10:15 AM

## 2021-03-29 MED ORDER — DEXAMETHASONE 4 MG PO TABS
4.0000 mg | ORAL_TABLET | Freq: Once | ORAL | Status: AC
  Administered 2021-03-29: 4 mg via ORAL

## 2021-03-29 NOTE — Progress Notes (Signed)
Patient noted to be in severe pain this am, and not progressing well as expected. PT tried to help patient with therapy, but patient needs more education and support. Patient c/o pain on LLE with knee buckling with every movement. Patient will be transfer to another unit due to safety concerns for home environment. On call MD notified.

## 2021-03-29 NOTE — TOC Transition Note (Signed)
Transition of Care [facility]) - CM/SW Discharge Note   Patient Details  Name: Amanda Hanna MRN: [id number] Date of Birth: 13-Sep-1940  Transition of Care [facility]) CM/SW Contact:  Bartholomew Crews, RN Phone Number: [phone] 03/29/2021, 9:05 AM   Clinical Narrative:     Spoke with patient at the bedside to discuss transition home today. Advised of recommendations for [facility] PT and OT. Patient is agreeable. Shared home health choice list from [email or web address]. No preference for agency. [facility] accepted referral. Spoke with provider for [facility] orders. No further TOC needs identified at this time.   Final next level of care: Home w Home Health Services Barriers to Discharge: No Barriers Identified   Patient Goals and CMS Choice   CMS Medicare.gov Compare Post Acute Care list provided to:: Patient Choice offered to / list presented to : Patient  Discharge Placement                       Discharge Plan and Services                DME Arranged: 3-N-1 DME Agency: [facility]       HH Arranged: PT, OT HH Agency: [place] Date [place]: 03/29/21 Time [place]: [phone] Representative spoke with at [place]: [place] ([place]) Interventions     Readmission Risk Interventions No flowsheet data found.

## 2021-03-29 NOTE — Discharge Instructions (Signed)
Wound Care Remove dressing in 2 days and leave it off Leave incision open to air. You may shower. Do not scrub directly on incision.  Do not put any creams, lotions, or ointments on incision. Activity Walk each and every day, increasing distance each day. No lifting greater than 5 lbs.  Avoid excessive back bending. No driving for 2 weeks; may ride as a passenger locally.  Diet Resume your normal diet.   Call Your Doctor If Any of These Occur Redness, drainage, or swelling at the wound.  Temperature greater than 101 degrees. Severe pain not relieved by pain medication. Increased difficulty swallowing. Incision starts to come apart. Follow Up Appt Call today for appointment in 3 weeks ([phone]) or for problems.  If you have any hardware placed in your spine, you will need an x-ray before your appointment.

## 2021-03-29 NOTE — Progress Notes (Signed)
Patient is transferred from room 3C10 to unit 5N20 at this time. Alert and in stable condition. No IV site. Report given to receiving nurse Angelito, RN with all questions answered. Left unit via wheelchair with all belongings and spouse at side.

## 2021-03-29 NOTE — Progress Notes (Signed)
Physical Therapy Treatment Patient Details Name: Amanda Hanna MRN: [id number] DOB: [birth year] Today's Date: 03/29/2021   History of Present Illness The pt is a 80 yo female presenting 10/13 for hemilaminectomy and discectomy of L2-3 with decompression adn fusion due to chronic pain. PMH includes: depression, osteoporosis, and skin cancer.    PT Comments    Pt sitting up in recliner on entry with brace off. Educated on need to have brace on when up and out of bed. Pt verbalizes understanding. Educated pt on donning brace and readjusting to insure snug fit. Pt agreeable to get up and walk with therapy. Pt requiring min A for coming into standing, with limited weightbearing through L LE. Once in standing and getting balance by shifting weight into L LE pt cries in pain and describes it shooting through her L buttocks and down her LE. Pt steps L LE forward and when weightshifts onto L LE cries in pain and experiences L knee buckling requiring modA for safely returning to chair. PT spoke to RN and returned to room. Pt again requiring min A for power up and similarly takes steps with increasing L LE pain and L knee buckling. RN administered pain medication and PT returned an hour later and pt only able to ambulate 5 feet at this time with similar pain and L knee buckling. Pt unable to lift L LE high enough to place on step tread. PT recommending pt discharge plans be changed to SNF given inability to safely mobilize up 5 steps into her home and then ambulate in her home. RN clarified with pt and husband desire to go to SNF for safe rehab prior to discharge home.    Recommendations for follow up therapy are one component of a multi-disciplinary discharge planning process, led by the attending physician.  Recommendations may be updated based on patient status, additional functional criteria and insurance authorization.  Follow Up Recommendations  SNF           Precautions / Restrictions  Precautions Precautions: Back;Fall Precaution Booklet Issued: Yes (comment) Required Braces or Orthoses: Spinal Brace Spinal Brace: Lumbar corset;Applied in sitting position Restrictions Weight Bearing Restrictions: No     Mobility  Bed Mobility               General bed mobility comments: sitting up in recliner    Transfers Overall transfer level: Needs assistance Equipment used: Rolling walker (2 wheeled) Transfers: Sit to/from Stand Sit to Stand: Min assist         General transfer comment: min A for power up to standing, does not bear weight on L LE even with cuing, requires increased time and effort to come to standing  Ambulation/Gait Ambulation/Gait assistance: Min assist;Mod assist Gait Distance (Feet): 5 Feet Assistive device: Rolling walker (2 wheeled) Gait Pattern/deviations: Step-to pattern;Decreased step length - right;Decreased stance time - left;Decreased dorsiflexion - left;Decreased weight shift to left;Trunk flexed Gait velocity: decreased Gait velocity interpretation: <1.31 ft/sec, indicative of household ambulator General Gait Details: min A progressing to modA for additional steps due to inability to bear weight through L LE, due to shooting pain from back into buttocks and down L LE causing L knee buckling , attempted ambulation 2x and then again after pain medications given, able to progress 3 more feet with pain medication however continues to be limited by L knee buckling   Stairs         General stair comments: although she was able to ascend 5 steps yesterday,  pt unable to lift L LE high enough to place on step         Balance Overall balance assessment: Needs assistance Sitting-balance support: No upper extremity supported;Feet supported Sitting balance-Leahy Scale: Good     Standing balance support: Bilateral upper extremity supported Standing balance-Leahy Scale: Poor Standing balance comment: able to maintain with single UE  support, improved with BUE support for gait                            Cognition Arousal/Alertness: Awake/alert Behavior During Therapy: WFL for tasks assessed/performed Overall Cognitive Status: Impaired/Different from baseline Area of Impairment: Memory                     Memory: Decreased short-term memory         General Comments: can not remember what surgeon said yesterday, difficulty remembering how to don brace, does not remember stair training with PT yesterday         General Comments General comments (skin integrity, edema, etc.): VSS on RA,      Pertinent Vitals/Pain Pain Assessment: 0-10 Pain Score: 8  Pain Location: central back, L buttocks, radiating down L LE Pain Descriptors / Indicators: Operative site guarding;Moaning;Sharp;Shooting Pain Intervention(s): Limited activity within patient's tolerance;Monitored during session;Premedicated before session     PT Goals (current goals can now be found in the care plan section) Acute Rehab PT Goals Patient Stated Goal: return home PT Goal Formulation: With patient Time For Goal Achievement: 04/11/21 Potential to Achieve Goals: Good Progress towards PT goals: Not progressing toward goals - comment (limited by L LE pain and weakness)    Frequency    Min 5X/week      PT Plan Discharge plan needs to be updated;Equipment recommendations need to be updated;Other (comment)       AM-PAC PT "6 Clicks" Mobility   Outcome Measure  Help needed turning from your back to your side while in a flat bed without using bedrails?: A Little Help needed moving from lying on your back to sitting on the side of a flat bed without using bedrails?: A Little Help needed moving to and from a bed to a chair (including a wheelchair)?: A Little Help needed standing up from a chair using your arms (e.g., wheelchair or bedside chair)?: A Little Help needed to walk in hospital room?: A Lot Help needed climbing  3-5 steps with a railing? : Total 6 Click Score: 15    End of Session Equipment Utilized During Treatment: Gait belt;Back brace Activity Tolerance: Patient limited by pain;Other (comment) (L LE weakness) Patient left: in chair;with call bell/phone within reach Nurse Communication: Mobility status PT Visit Diagnosis: Other abnormalities of gait and mobility (R26.89);Muscle weakness (generalized) (M62.81);Pain Pain - Right/Left: Left Pain - part of body: Leg     Time: 9163-8466 ([phone]) PT Time Calculation (min) (ACUTE ONLY): 33 min  Charges:  $Gait Training: 23-37 mins                     Tomika Eckles B. Migdalia Dk PT, DPT Acute Rehabilitation Services Pager [phone] Office [phone]    [place] 03/29/2021, 10:40 AM

## 2021-03-29 NOTE — Progress Notes (Signed)
   Providing Compassionate, Quality Care - Together   Following today's session with physical therapy, Amanda Hanna and the therapist felt Amanda Hanna would benefit from further rehabilitation at a skilled nursing facility. Amanda Hanna was unable to mobilize independently and her husband was not comfortable with the level of assistance she is presently requiring. Discharge orders have been discontinued. Amanda Hanna will be transferred out of 3C and plans for discharging to a SNF will commence.  Viona Gilmore, DNP, AGNP-C Nurse Practitioner 03/29/2021, 11:00 AM  [facility] [place] [address], [address], [place],  [address] P: [phone]    F: [phone]

## 2021-03-29 NOTE — Progress Notes (Signed)
Pt seen in room alert/oriented in no apparent distress. Husband at bedside. Menu provided to pt with instructions. Hospital valuables policy has been reinforced with no complaints. Voiced. Pt orientated to room/equipments. Hospital bed in lowest position with call bell/room phone within reach and all wheels locked.

## 2021-03-30 NOTE — TOC Progression Note (Signed)
Transition of Care [facility]) - Progression Note    Patient Details  Name: Amanda Hanna MRN: [id number] Date of Birth: June 25, 1940  Transition [facility]) CM/SW [place], [place] Phone Number: 03/30/2021, 12:27 PM  Clinical Narrative:   CSW noting that disposition has now been changed to SNF, due to patient pain and weakness. CSW faxed out referral for SNF and will follow with bed offers.     Expected Discharge Plan: Skilled Nursing Facility Barriers to Discharge: Continued Medical Work up, Ship broker  Expected Discharge Plan and Services Expected Discharge Plan: [place]         Expected Discharge Date: 03/29/21               DME Arranged: 3-N-1 DME Agency: [facility]       HH Arranged: PT, OT HH Agency: [place] Date HH Agency Contacted: 03/29/21 Time [place]: [phone] Representative spoke with at [place]: [place] ([place]) Interventions    Readmission Risk Interventions No flowsheet data found.

## 2021-03-30 NOTE — NC FL2 (Signed)
[place] MEDICAID FL2 LEVEL OF CARE SCREENING TOOL     IDENTIFICATION  Patient Name: Amanda Hanna Birthdate: [birth year] Sex: female Admission Date (Current Location): 03/27/2021  [facility] and [place] Number:  Herbalist and Address:  [facility] [place]. 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 03/27/2021    Orientation RESPIRATION BLADDER Height & Weight     Self, Time, Situation, Place  Normal Continent Weight:   Height:     BEHAVIORAL SYMPTOMS/MOOD NEUROLOGICAL BOWEL NUTRITION STATUS      Continent Diet (see DC summary)  AMBULATORY STATUS COMMUNICATION OF NEEDS Skin   Limited Assist Verbally Surgical wounds (closed back, honeycomb dressing)                       Personal Care Assistance Level of Assistance  Bathing, Feeding, Dressing Bathing Assistance: Limited assistance Feeding assistance: Independent Dressing Assistance: Limited assistance     Functional Limitations Info  Sight Sight Info: Impaired (glasses)        [place]  PT (By licensed PT), OT (By licensed OT)     PT Frequency: 5x/wk OT Frequency: 5x/wk            Contractures Contractures Info: Not present    Additional Factors Info  Code Status, Allergies Code Status Info: Full Allergies Info: Boniva (Ibandronic Acid), Sulfa Antibiotics           Current Medications (03/30/2021):  This is the current hospital active medication list Current Facility-Administered Medications  Medication Dose Route  Frequency Provider Last Rate Last Admin   0.9 %  sodium chloride infusion  250 mL Intravenous Continuous Kristeen Miss, MD       acetaminophen (TYLENOL) tablet 650 mg  650 mg Oral Q4H PRN Kristeen Miss, MD       Or   acetaminophen (TYLENOL) suppository 650 mg  650 mg Rectal Q4H PRN Kristeen Miss, MD       alum & mag hydroxide-simeth (MAALOX/MYLANTA) 200-200-20 MG/5ML suspension 30 mL  30 mL Oral Q6H PRN Kristeen Miss, MD       atorvastatin (LIPITOR) tablet 40 mg  40 mg Oral Daily Kristeen Miss, MD   40 mg at 03/30/21 0850   bisacodyl (DULCOLAX) suppository 10 mg  10 mg Rectal Daily PRN Kristeen Miss, MD       docusate sodium (COLACE) capsule 100 mg  100 mg Oral BID Kristeen Miss, MD   100 mg at 03/30/21 0850   fluticasone (FLONASE) 50 MCG/ACT nasal spray 1 spray  1 spray Each Nare BID PRN Kristeen Miss, MD       lactated ringers infusion   Intravenous Continuous Kristeen Miss, MD       menthol-cetylpyridinium (CEPACOL) lozenge 3 mg  1 lozenge Oral PRN Kristeen Miss, MD       Or   phenol (CHLORASEPTIC) mouth spray 1 spray  1 spray Mouth/Throat PRN Kristeen Miss, MD       methocarbamol (ROBAXIN) tablet 500 mg  500 mg Oral Q6H PRN Kristeen Miss, MD   500 mg at 03/29/21 0501   Or   methocarbamol (ROBAXIN) 500 mg in dextrose 5 % 50 mL IVPB  500 mg Intravenous Q6H PRN Kristeen Miss, MD       morphine 2 MG/ML injection 2-4 mg  2-4 mg Intravenous Q2H PRN Kristeen Miss, MD       ondansetron (ZOFRAN) tablet 4 mg  4 mg Oral Q6H PRN Kristeen Miss, MD       Or   ondansetron (ZOFRAN) injection 4 mg  4 mg Intravenous Q6H PRN Kristeen Miss, MD       oxyCODONE-acetaminophen (PERCOCET/ROXICET) 5-325 MG per tablet 1-2 tablet  1-2 tablet Oral Q4H PRN Kristeen Miss, MD   1 tablet at 03/29/21 2156   pantoprazole (PROTONIX) EC tablet 40 mg  40 mg Oral Daily Kristeen Miss, MD   40 mg at 03/30/21 0850   polyethylene glycol (MIRALAX / GLYCOLAX) packet 17 g  17 g Oral Daily PRN Kristeen Miss, MD       senna (SENOKOT)  tablet 8.6 mg  1 tablet Oral BID Kristeen Miss, MD   8.6 mg at 03/30/21 0851   sodium chloride flush (NS) 0.9 % injection 3 mL  3 mL Intravenous Q12H Kristeen Miss, MD       sodium chloride flush (NS) 0.9 % injection 3 mL  3 mL Intravenous PRN Kristeen Miss, MD       sodium phosphate (FLEET) 7-19 GM/118ML enema 1 enema  1 enema Rectal Once PRN Kristeen Miss, MD       triamterene-hydrochlorothiazide (MAXZIDE-25) 37.5-25 MG per tablet 1 tablet  1 tablet Oral Daily PRN Kristeen Miss, MD   1 tablet at 03/29/21 2355     Discharge Medications: Please see discharge summary for a list of discharge medications.  Relevant Imaging Results:  Relevant Lab Results:   Additional Information SS#: [id number]  Geralynn Ochs, LCSW

## 2021-03-30 NOTE — Progress Notes (Signed)
Physical Therapy Treatment Patient Details Name: Amanda Hanna MRN: [id number] DOB: July 27, 1940 Today's Date: 03/30/2021   History of Present Illness The pt is a 80 yo female presenting 10/13 for hemilaminectomy and discectomy of L2-3 with decompression adn fusion due to chronic pain. PMH includes: depression, osteoporosis, and skin cancer.    PT Comments    Continuing work on functional mobility and activity tolerance;  Session focused on functional transfers, and teaching pt and husband how and when to don/doff brace; Will evaluate pt's ability to teach back spinal brace considerations next session;   Still with precarious gait, unsteady, and high fall risk;   Took extra time to discuss dc recommendatins for SNF for post-acute rehab   Recommendations for follow up therapy are one component of a multi-disciplinary discharge planning process, led by the attending physician.  Recommendations may be updated based on patient status, additional functional criteria and insurance authorization.  Follow Up Recommendations  SNF     Equipment Recommendations   (tub bench, youth-sized RW)    Recommendations for Other Services       Precautions / Restrictions Precautions Precautions: Back;Fall Required Braces or Orthoses: Spinal Brace Spinal Brace: Lumbar corset;Applied in sitting position Restrictions Weight Bearing Restrictions: No     Mobility  Bed Mobility Overal bed mobility: Needs Assistance Bed Mobility: Rolling;Sit to Sidelying Rolling: Min assist       Sit to sidelying: Mod assist General bed mobility comments: Light mod asssit to help LEs into bed    Transfers Overall transfer level: Needs assistance Equipment used: Rolling walker (2 wheeled) Transfers: Sit to/from Stand Sit to Stand: Min assist         General transfer comment: min A for power up to standing, able to bear slightly more weight on L LE , requires increased time and effort to come to  standing  Ambulation/Gait Ambulation/Gait assistance: Min assist;Mod assist Gait Distance (Feet): 5 Feet Assistive device: Rolling walker (2 wheeled) Gait Pattern/deviations: Step-to pattern;Decreased step length - right;Decreased stance time - left;Decreased dorsiflexion - left;Decreased weight shift to left;Trunk flexed Gait velocity: decreased   General Gait Details: Took a few steps from recliner back to bed with RW; needs youth-sized RW in her room -- she used teh rW in her room with elbow propping on the handles teh entirety of the walk   Stairs             Wheelchair Mobility    Modified Rankin (Stroke Patients Only)       Balance     Sitting balance-Leahy Scale: Good       Standing balance-Leahy Scale: Poor                              Cognition Arousal/Alertness: Awake/alert Behavior During Therapy: WFL for tasks assessed/performed Overall Cognitive Status: Impaired/Different from baseline Area of Impairment: Memory                     Memory: Decreased short-term memory         General Comments: Lots of questions re: dc planning; Described SNF for post-acute rehab, and had to repeat many key points      Exercises      General Comments        Pertinent Vitals/Pain Pain Assessment: 0-10 Pain Score: 5  Faces Pain Scale: Hurts little more Pain Location: central back, L buttocks, radiating down L LE Pain Descriptors /  Indicators: Operative site guarding;Moaning;Sharp;Shooting Pain Intervention(s): Monitored during session;Repositioned    Home Living                      Prior Function            PT Goals (current goals can now be found in the care plan section) Acute Rehab PT Goals Patient Stated Goal: return home PT Goal Formulation: With patient Time For Goal Achievement: 04/11/21 Potential to Achieve Goals: Good Progress towards PT goals: Progressing toward goals (slowly)    Frequency    Min  3X/week      PT Plan Current plan remains appropriate;Frequency needs to be updated    Co-evaluation              AM-PAC PT "6 Clicks" Mobility   Outcome Measure  Help needed turning from your back to your side while in a flat bed without using bedrails?: A Little Help needed moving from lying on your back to sitting on the side of a flat bed without using bedrails?: A Little Help needed moving to and from a bed to a chair (including a wheelchair)?: A Little Help needed standing up from a chair using your arms (e.g., wheelchair or bedside chair)?: A Little Help needed to walk in hospital room?: A Lot Help needed climbing 3-5 steps with a railing? : Total 6 Click Score: 15    End of Session Equipment Utilized During Treatment: Gait belt;Back brace Activity Tolerance: Patient limited by pain;Other (comment) (L LE weakness) Patient left: in bed;with call bell/phone within reach;with bed alarm set;with family/visitor present Nurse Communication: Mobility status PT Visit Diagnosis: Other abnormalities of gait and mobility (R26.89);Muscle weakness (generalized) (M62.81);Pain Pain - Right/Left: Left Pain - part of body: Leg     Time: 1344-1415 PT Time Calculation (min) (ACUTE ONLY): 31 min  Charges:  $Gait Training: 8-22 mins $Therapeutic Activity: 8-22 mins                     Amanda Hanna, PT  Acute Rehabilitation Services Pager [phone] Office [phone]    Amanda Hanna 03/30/2021, 6:01 PM

## 2021-03-30 NOTE — Progress Notes (Signed)
Subjective: The patient is alert and pleasant.  She continues to complain of back and leg pain.  She has not mobilized much.  She is not ready to go home as she lives with her elderly husband.  Objective: Vital signs in last 24 hours: Temp:  [97.4 F (36.3 C)-99.8 F (37.7 C)] 97.7 F (36.5 C) (10/16 0458) Pulse Rate:  [85-102] 85 (10/16 0458) Resp:  [17-18] 17 (10/16 0458) BP: (103-144)/(71-82) 129/72 (10/16 0458) SpO2:  [89 %-96 %] 95 % (10/16 0458) Estimated body mass index is 39.49 kg/m as calculated from the following:   Height as of 03/25/21: 5' (1.524 m).   Weight as of 03/25/21: 91.7 kg.   Intake/Output from previous day: No intake/output data recorded. Intake/Output this shift: No intake/output data recorded.  Physical exam the patient is alert and pleasant.  She is moving her lower extremities well.  Lab Results: Recent Labs    03/28/21 0159  WBC 14.2*  HGB 13.5  HCT 39.6  PLT 269   BMET Recent Labs    03/28/21 0159  NA 138  K 4.7  CL 106  CO2 25  GLUCOSE 157*  BUN 7*  CREATININE 0.82  CALCIUM 8.4*    Studies/Results: No results found.  Assessment/Plan: Postop day #3: We will continue to mobilize the patient.  She may need rehab/skilled nursing facility.  LOS: 3 days     Ophelia Charter 03/30/2021, 9:55 AM     Patient ID: Amanda Hanna, female   DOB: 1940-09-11, 80 y.o.   MRN: [id number]

## 2021-03-31 MED ORDER — ENOXAPARIN SODIUM 40 MG/0.4ML IJ SOSY
40.0000 mg | PREFILLED_SYRINGE | INTRAMUSCULAR | Status: DC
  Administered 2021-03-31 – 2021-04-03 (×4): 40 mg via SUBCUTANEOUS
  Filled 2021-03-31 (×4): qty 0.4

## 2021-03-31 MED ORDER — CIPROFLOXACIN HCL 500 MG PO TABS
250.0000 mg | ORAL_TABLET | Freq: Two times a day (BID) | ORAL | Status: DC
  Administered 2021-03-31 – 2021-04-01 (×2): 250 mg via ORAL
  Filled 2021-03-31 (×2): qty 1

## 2021-03-31 NOTE — TOC Progression Note (Signed)
Transition of Care [facility]) - Initial/Assessment Note    Patient Details  Name: Amanda Hanna MRN: [id number] Date of Birth: Jun 03, 1941  [facility]) CM/SW Contact:    Milinda Antis, LCSWA Phone Number: 03/31/2021, 3:37 PM  Clinical Narrative:                 CSW met with the patient and presented bed offers.  The patient choose [facility] and the facility can accept.  Insurance auth requested.  Expected Discharge Plan: Skilled Nursing Facility Barriers to Discharge: Continued Medical Work up, Ship broker   Patient Goals and CMS Choice   CMS Medicare.gov Compare Post Acute Care list provided to:: Patient Choice offered to / list presented to : Patient  Expected Discharge Plan and Services Expected Discharge Plan: [place]         Expected Discharge Date: 03/29/21               DME Arranged: 3-N-1 DME Agency: [employer]       HH Arranged: PT, OT HH Agency: [place] Date HH Agency Contacted: 03/29/21 Time HH Agency Contacted: [phone] Representative spoke with at 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

## 2021-03-31 NOTE — Progress Notes (Signed)
Patient ID: Amanda Hanna, female   DOB: 08-09-1940, 80 y.o.   MRN: [id number] Patient has been having significant dysuria today.  Noted her discharge was canceled after it was felt that she may need some convalescence and physical therapy in the skilled nursing facility.  Arrangements are currently in the works.  We will send the urine for culture and sensitivity and I have started the patient on some Cipro.

## 2021-04-01 LAB — RESP PANEL BY RT-PCR (FLU A&B, COVID) ARPGX2
Influenza A by PCR: NEGATIVE
Influenza B by PCR: NEGATIVE
SARS Coronavirus 2 by RT PCR: NEGATIVE

## 2021-04-01 MED ORDER — OXYCODONE-ACETAMINOPHEN 5-325 MG PO TABS: 1.0000 | ORAL_TABLET | ORAL | 0 refills | Status: AC | PRN

## 2021-04-01 MED ORDER — OXYCODONE-ACETAMINOPHEN 5-325 MG PO TABS: 1.0000 | ORAL_TABLET | ORAL | 0 refills | Status: DC | PRN

## 2021-04-01 MED ORDER — CIPROFLOXACIN HCL 250 MG PO TABS: 250.0000 mg | ORAL_TABLET | Freq: Two times a day (BID) | ORAL | 0 refills | Status: DC

## 2021-04-01 MED ORDER — CIPROFLOXACIN HCL 500 MG PO TABS
500.0000 mg | ORAL_TABLET | Freq: Two times a day (BID) | ORAL | Status: DC
  Administered 2021-04-01 – 2021-04-03 (×4): 500 mg via ORAL
  Filled 2021-04-01 (×4): qty 1

## 2021-04-01 MED ORDER — CIPROFLOXACIN HCL 500 MG PO TABS: 500.0000 mg | ORAL_TABLET | Freq: Two times a day (BID) | ORAL | 0 refills | Status: AC

## 2021-04-01 NOTE — Progress Notes (Addendum)
Patient ID: Amanda Hanna, female   DOB: 1941-05-09, 80 y.o.   MRN: [id number] Patient appears to be doing somewhat better.  UTI positive for pseudomonas. Still problematic. Will increase Cipro to 500 bid. She is ready for transfer to skilled nursing facility.  We will plan this for tomorrow.

## 2021-04-01 NOTE — TOC Progression Note (Signed)
Transition of Care [facility]) - Initial/Assessment Note    Patient Details  Name: AIJA SCARFO MRN: [id number] Date of Birth: 10/10/1940  Transition of Care [facility]) CM/SW Contact:    Milinda Antis, LCSWA Phone Number: 04/01/2021, 10:34 AM  Clinical Narrative:                 Insurance authorization was received,  10/18 - 10/20.  Reference ID: [id number] and the patient can d/c to [place] when medically ready.    10:30-  CSW attempted to contact the attending to inquire about whether the patient was medically ready.  CSW spoke with the patient's secretary, Caren Griffins, who stated that the attending was in the OR and would follow up when available.  TOC barriers to d/c: Discharge summary  Expected Discharge Plan: [place] Barriers to Discharge: Continued Medical Work up, Ship broker   Patient Goals and CMS Choice   CMS Medicare.gov Compare Post Acute Care list provided to:: Patient Choice offered to / list presented to : Patient  Expected Discharge Plan and Services Expected Discharge Plan: [place]         Expected Discharge Date: 03/29/21               DME Arranged: 3-N-1 DME Agency: [employer]       HH Arranged: PT, OT HH Agency: [place] Date HH Agency Contacted: 03/29/21 Time HH Agency Contacted: [phone] Representative spoke with at [place]: [place] Arrangements/Services                       Activities of Daily Living      Permission Sought/Granted                  Emotional Assessment              Admission diagnosis:  Herniated nucleus pulposus, 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

## 2021-04-01 NOTE — Care Management Important Message (Signed)
Important Message  Patient Details  Name: Amanda Hanna MRN: [id number] Date of Birth: March 02, 1941   Medicare Important Message Given:  Yes     Amanda Hanna [address] 04/01/2021, 12:32 PM

## 2021-04-01 NOTE — Progress Notes (Signed)
Physical Therapy Treatment Patient Details Name: Amanda Hanna MRN: [id number] DOB: [birth year] Today's Date: 04/01/2021   History of Present Illness The pt is a 80 yo female presenting 10/13 for hemilaminectomy and discectomy of L2-3 with decompression adn fusion due to chronic pain. D/C put on hold due to new onset UTI. PMH includes: depression, osteoporosis, and skin cancer.    PT Comments    Pt. Demos poor recall of precautions and is noncompliant with bracing.  Has not been practicing log rolling either.  Pt. Is encouraged to follow precautions and bracing in order to allow surgical site to appropriately heal.  PT trials youth sized RW and although pt. Demos improved posture with activity, continues to require mod A for safety due to reports of LE pain.  Gait distance remains limited due to poor activity tolerance/pain.  PT to work towards increasing gait distance to pt. Tolerance.   Recommendations for follow up therapy are one component of a multi-disciplinary discharge planning process, led by the attending physician.  Recommendations may be updated based on patient status, additional functional criteria and insurance authorization.  Follow Up Recommendations  SNF     Equipment Recommendations   (Youth RW)    Recommendations for Other Services       Precautions / Restrictions Precautions Precautions: Back Required Braces or Orthoses: Spinal Brace Spinal Brace: Lumbar corset Restrictions Weight Bearing Restrictions: No     Mobility  Bed Mobility Overal bed mobility: Needs Assistance Bed Mobility: Sidelying to Sit Rolling: Min assist Sidelying to sit: Min assist       General bed mobility comments: Pt. initially tries to get OOB without using log roll technique.  When PT explains log roll, pt. states that "no one has told her that before".  Pt. is able to log roll and complete SL > sit with min A only.  PT encourages pt. to use reverse log rolling when returning to  bed later in day. Patient Response: Cooperative  Transfers Overall transfer level: Needs assistance Equipment used: Rolling walker (2 wheeled) Transfers: Sit to/from [facility] Sit to Stand: Min assist;Mod assist Stand pivot transfers: Mod assist       General transfer comment: Pt. is provided with youth RW due to short stature. Pt. does not remember how to don brace, PT assists. PT encourages pt. to push up from bed for transfer, but pt. states she can't.  Insists on pulling up on RW.  Demos forward flexed posture, resting R forearm on RW handle.  Req's mod A to complete transfer over to [facility].  Fair balance.  Able to follow instructions to reach back for chair with stand > sit with good eccentric control noted.  Ambulation/Gait Ambulation/Gait assistance: Mod assist Gait Distance (Feet): 3 Feet Assistive device: Rolling walker (2 wheeled) Gait Pattern/deviations: Decreased step length - right;Decreased step length - left;Trunk flexed     General Gait Details: After use of BSC, pt. is encouraged to amb to chair.  PT requests that pt. focus on upright posture during amb.  Pt. able to correct UE and trunk positioning, but continues to demo fair balance and foot clearance.   Stairs             Wheelchair Mobility    Modified Rankin (Stroke Patients Only)       Balance           Standing balance support: Bilateral upper extremity supported;During functional activity Standing balance-Leahy Scale: Poor  Cognition Arousal/Alertness: Awake/alert Behavior During Therapy: WFL for tasks assessed/performed Overall Cognitive Status:  (Pt. states she's been having trouble with her memory since anethesia.) Area of Impairment: Memory                     Memory: Decreased recall of precautions         General Comments: Pt. is supine in bed when PT arrives. C/o UTI.  Cannot recall any precautions, PT takes time  to review them with pt.  Pt. admits to not wearing her lumbar brace while OOB.  She states she doesn't stay OOB long enough to make it worth it to keep it on.  PT explains importance of bracing with pt. and encourages pt. to wear brace with functional mobility.      Exercises      General Comments        Pertinent Vitals/Pain Pain Assessment: Faces Faces Pain Scale: Hurts little more Pain Location: Pt. reports that discomfort from UTI is currently worse than back pain; also c/o LE pain with movement. Pain Descriptors / Indicators: Aching;Discomfort;Guarding;Tender Pain Intervention(s): Limited activity within patient's tolerance;Monitored during session;Repositioned    Home Living                      Prior Function            PT Goals (current goals can now be found in the care plan section) Progress towards PT goals: Progressing toward goals    Frequency           PT Plan Current plan remains appropriate    Co-evaluation              AM-PAC PT "6 Clicks" Mobility   Outcome Measure  Help needed turning from your back to your side while in a flat bed without using bedrails?: A Little Help needed moving from lying on your back to sitting on the side of a flat bed without using bedrails?: A Little Help needed moving to and from a bed to a chair (including a wheelchair)?: A Lot Help needed standing up from a chair using your arms (e.g., wheelchair or bedside chair)?: A Lot Help needed to walk in hospital room?: A Lot Help needed climbing 3-5 steps with a railing? : A Lot 6 Click Score: 14    End of Session Equipment Utilized During Treatment: Gait belt Activity Tolerance: Patient limited by pain Patient left: in chair;with call bell/phone within reach;with chair alarm set         Time: 3202-3343 PT Time Calculation (min) (ACUTE ONLY): 23 min  Charges:  $Gait Training: 8-22 mins $Therapeutic Activity: 8-22 mins                     Evelynne Spiers A.  Camika Marsico, PT, DPT Acute Rehabilitation Services Office: [place] 04/01/2021, 10:03 AM

## 2021-04-02 NOTE — TOC Progression Note (Signed)
Transition of Care [facility]) - Progression Note    Patient Details  Name: Amanda Hanna MRN: [id number] Date of Birth: 01-15-1941  Transition of Care [facility]) CM/SW [place], [place] Phone Number: 04/02/2021, 4:42 PM  Clinical Narrative:     CSW awaiting DC summary.CSW received notification from [facility] unable to accept patient today. MD informed.CSW will follow up with facility in the am. Patient has SNF bed at [facility]. Pending DC summary. CSW will continue to follow and assist with patients dc planning needs.  Expected Discharge Plan: Skilled Nursing Facility Barriers to Discharge: Continued Medical Work up, Ship broker  Expected Discharge Plan and Services Expected Discharge Plan: [place]         Expected Discharge Date: 04/02/21               DME Arranged: 3-N-1 DME Agency: [employer]       HH Arranged: PT, OT HH Agency: [place] Date HH Agency Contacted: 03/29/21 Time [place]: [phone] Representative spoke with at [place]: [place] ([place]) Interventions    Readmission Risk Interventions No flowsheet data found.

## 2021-04-02 NOTE — TOC Progression Note (Signed)
Transition of Care [facility]) - Initial/Assessment Note    Patient Details  Name: Amanda Hanna MRN: [id number] Date of Birth: Mar 18, 1941  Transition of Care [facility]) CM/SW Contact:    Milinda Antis, LCSWA Phone Number: 04/02/2021, 10:04 AM  Clinical Narrative:                 CSW received a call from [place] with [facility] inquiring about when the patient can discharge.  CSW informed that the patient has a discharge order in and that we are awaiting the d/c summary.  The patient will be going to room 204 when discharged.  Expected Discharge Plan: Skilled Nursing Facility Barriers to Discharge: Continued Medical Work up, Ship broker   Patient Goals and CMS Choice   CMS Medicare.gov Compare Post Acute Care list provided to:: Patient Choice offered to / list presented to : Patient  Expected Discharge Plan and Services Expected Discharge Plan: [place]         Expected Discharge Date: 04/02/21               DME Arranged: 3-N-1 DME Agency: [employer]       HH Arranged: PT, OT HH Agency: [place] Date [place]: 03/29/21 Time HH Agency Contacted: [phone] Representative spoke with at [place]: [place] Arrangements/Services                       Activities of Daily Living      Permission Sought/Granted                  Emotional Assessment              Admission diagnosis:  Herniated nucleus pulposus, 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

## 2021-04-03 ENCOUNTER — Other Ambulatory Visit: Payer: Self-pay

## 2021-04-03 DIAGNOSIS — M5126 Other intervertebral disc displacement, lumbar region: Secondary | ICD-10-CM | POA: Diagnosis not present

## 2021-04-03 DIAGNOSIS — K30 Functional dyspepsia: Secondary | ICD-10-CM | POA: Diagnosis not present

## 2021-04-03 DIAGNOSIS — E559 Vitamin D deficiency, unspecified: Secondary | ICD-10-CM | POA: Diagnosis not present

## 2021-04-03 DIAGNOSIS — D72829 Elevated white blood cell count, unspecified: Secondary | ICD-10-CM | POA: Diagnosis not present

## 2021-04-03 DIAGNOSIS — M25511 Pain in right shoulder: Secondary | ICD-10-CM | POA: Diagnosis not present

## 2021-04-03 DIAGNOSIS — F32A Depression, unspecified: Secondary | ICD-10-CM | POA: Diagnosis not present

## 2021-04-03 DIAGNOSIS — J329 Chronic sinusitis, unspecified: Secondary | ICD-10-CM | POA: Diagnosis not present

## 2021-04-03 DIAGNOSIS — Z4789 Encounter for other orthopedic aftercare: Secondary | ICD-10-CM | POA: Diagnosis not present

## 2021-04-03 DIAGNOSIS — R0902 Hypoxemia: Secondary | ICD-10-CM | POA: Diagnosis not present

## 2021-04-03 DIAGNOSIS — I8390 Asymptomatic varicose veins of unspecified lower extremity: Secondary | ICD-10-CM | POA: Diagnosis not present

## 2021-04-03 DIAGNOSIS — M62838 Other muscle spasm: Secondary | ICD-10-CM | POA: Diagnosis not present

## 2021-04-03 DIAGNOSIS — N390 Urinary tract infection, site not specified: Secondary | ICD-10-CM | POA: Diagnosis not present

## 2021-04-03 DIAGNOSIS — E7800 Pure hypercholesterolemia, unspecified: Secondary | ICD-10-CM | POA: Diagnosis not present

## 2021-04-03 DIAGNOSIS — M4326 Fusion of spine, lumbar region: Secondary | ICD-10-CM | POA: Diagnosis not present

## 2021-04-03 DIAGNOSIS — M810 Age-related osteoporosis without current pathological fracture: Secondary | ICD-10-CM | POA: Diagnosis not present

## 2021-04-03 DIAGNOSIS — R404 Transient alteration of awareness: Secondary | ICD-10-CM | POA: Diagnosis not present

## 2021-04-03 DIAGNOSIS — M545 Low back pain, unspecified: Secondary | ICD-10-CM | POA: Diagnosis not present

## 2021-04-03 DIAGNOSIS — E119 Type 2 diabetes mellitus without complications: Secondary | ICD-10-CM | POA: Diagnosis not present

## 2021-04-03 DIAGNOSIS — Z743 Need for continuous supervision: Secondary | ICD-10-CM | POA: Diagnosis not present

## 2021-04-03 DIAGNOSIS — Z85828 Personal history of other malignant neoplasm of skin: Secondary | ICD-10-CM | POA: Diagnosis not present

## 2021-04-03 LAB — URINE CULTURE: Culture: >=100000 — AB

## 2021-04-03 NOTE — TOC Transition Note (Signed)
Transition of Care [facility]) - CM/SW Discharge Note   Patient Details  Name: Amanda Hanna MRN: [id number] Date of Birth: 1940-11-13  [facility]) CM/SW Contact:  Geralynn Ochs, LCSW Phone Number: 04/03/2021, 10:58 AM   Clinical Narrative:   Nurse to call report to [phone], Room 204.  Transport scheduled for 2:30 PM.    Final next level of care: [place] Barriers to Discharge: Barriers Resolved   Patient Goals and CMS Choice   CMS Medicare.gov Compare Post Acute Care list provided to:: Patient Choice offered to / list presented to : Patient  Discharge Placement              Patient chooses bed at: [place] Patient to be transferred to facility by: [facility] Name of family member notified: Self Patient and family notified of of transfer: 04/03/21  Discharge Plan and Services                DME Arranged: 3-N-1 DME Agency: [employer]       HH Arranged: PT, OT HH Agency: [place] Date HH Agency Contacted: 03/29/21 Time [place]: [phone] Representative spoke with at [place]: [place] ([place]) Interventions     Readmission Risk Interventions No flowsheet data found.

## 2021-04-03 NOTE — Progress Notes (Signed)
Physical Therapy Treatment Patient Details Name: Amanda Hanna MRN: [id number] DOB: 1941/02/15 Today's Date: 04/03/2021   History of Present Illness The pt is a 80 yo female presenting 10/13 for hemilaminectomy and discectomy of L2-3 with decompression adn fusion due to chronic pain. D/C put on hold due to new onset UTI. PMH includes: depression, osteoporosis, and skin cancer.    PT Comments    Pt. Demos improved strength and activity tolerance this tx session, able to amb increased distance with dec external support while maintaining improved upright posture.  Demos good tolerance to LE therex and is encouraged to begin amb to bathroom with assist from [place] made aware.  D/C to SNF delayed and AMPAC score now indicating home with HH, however, pt. Still req's mod A for safety with sit > stand that PT does not believe pt. Could safely accomplish with assist from elderly husband.   Recommendations for follow up therapy are one component of a multi-disciplinary discharge planning process, led by the attending physician.  Recommendations may be updated based on patient status, additional functional criteria and insurance authorization.  Follow Up Recommendations  SNF     Equipment Recommendations  Rolling walker with 5" wheels (Youth sized)    Recommendations for Other Services       Precautions / Restrictions Precautions Precautions: Back Required Braces or Orthoses: Spinal Brace Spinal Brace: Lumbar corset     Mobility  Bed Mobility Overal bed mobility: Needs Assistance Bed Mobility: Rolling;Sidelying to Sit Rolling: Min assist Sidelying to sit: Min assist     Sit to sidelying: Min assist General bed mobility comments: Pt. req's min A to successfully complete log roll and SL > sit in order to maintain lumbar precautions.  PT assists pt. to don lumbar brace in sitting. Patient Response: Cooperative  Transfers Overall transfer level: Needs assistance Equipment used:  Rolling walker (2 wheeled) Transfers: Sit to/from Stand Sit to Stand: Mod assist         General transfer comment: Performs sit > stand with mod A and use of RW.  Greatest difficulty with transitioning hands from EOB up to RW.  Demos improved upright posture today upon standing.  Ambulation/Gait Ambulation/Gait assistance: Min assist Gait Distance (Feet): 20 Feet Assistive device: Rolling walker (2 wheeled) Gait Pattern/deviations: Decreased step length - right;Decreased step length - left     General Gait Details: Pt. able to amb short distance in room with min A only.  C/o feeling like L LE might give out, but no buckling noted at this time.  Transfers into chair at end of amb.  Encouraged to start amb to bathroom wtih NSG.  NSG made aware.   Stairs             Wheelchair Mobility    Modified Rankin (Stroke Patients Only)       Balance           Standing balance support: Bilateral upper extremity supported;During functional activity Standing balance-Leahy Scale: Poor                              Cognition Arousal/Alertness: Awake/alert Behavior During Therapy: WFL for tasks assessed/performed Overall Cognitive Status: Within Functional Limits for tasks assessed                                 General Comments: Pt. is supine in  bed when PT arrives.  Agreeable to get OOB and up to chair.  Wants to try to amb in room.      Exercises General Exercises - Lower Extremity Ankle Circles/Pumps: AROM;Both;10 reps Long Arc Quad: AROM;Both;10 reps Heel Slides: AROM;Both;10 reps Hip ABduction/ADduction: AROM;Both;10 reps Hip Flexion/Marching: AROM;Both;10 reps    General Comments General comments (skin integrity, edema, etc.): Pt. educated to complete therex throughout day.  Understands that movements should not aggravate back pain.      Pertinent Vitals/Pain Pain Assessment: Faces Pain Score: 4  Faces Pain Scale: Hurts little  more Pain Location: L LE, lumbar spine Pain Descriptors / Indicators: Aching;Tender;Throbbing;Discomfort Pain Intervention(s): Limited activity within patient's tolerance;Monitored during session;Repositioned    Home Living                      Prior Function            PT Goals (current goals can now be found in the care plan section) Progress towards PT goals: Progressing toward goals    Frequency           PT Plan Current plan remains appropriate    Co-evaluation              AM-PAC PT "6 Clicks" Mobility   Outcome Measure  Help needed turning from your back to your side while in a flat bed without using bedrails?: A Little Help needed moving from lying on your back to sitting on the side of a flat bed without using bedrails?: A Little Help needed moving to and from a bed to a chair (including a wheelchair)?: A Little Help needed standing up from a chair using your arms (e.g., wheelchair or bedside chair)?: A Lot Help needed to walk in hospital room?: A Little Help needed climbing 3-5 steps with a railing? : A Lot 6 Click Score: 16    End of Session Equipment Utilized During Treatment: Gait belt Activity Tolerance: Patient tolerated treatment well Patient left: in chair;with call bell/phone within reach;with chair alarm set Nurse Communication: Mobility status       Time: 4765-4650 PT Time Calculation (min) (ACUTE ONLY): 24 min  Charges:  $Gait Training: 8-22 mins $Therapeutic Exercise: 8-22 mins                     Tomeko Scoville A. Bridgett Hattabaugh, PT, DPT Acute Rehabilitation Services Office: [employer] 04/03/2021, 9:13 AM

## 2021-04-03 NOTE — Progress Notes (Addendum)
Called facility, gave report to [facility], all questions answered. Pt not in acute distress, denies pain, sitting comfortably in chair, family at bedside updated.  14:48 Pt discharged to facility with belongings via ambulance service.

## 2021-04-03 NOTE — Plan of Care (Signed)
  Problem: Education: Goal: Ability to verbalize activity precautions or restrictions will improve Outcome: Progressing   Problem: Activity: Goal: Ability to avoid complications of mobility impairment will improve Outcome: Progressing   Problem: Clinical Measurements: Goal: Ability to maintain clinical measurements within normal limits will improve Outcome: Progressing   Problem: Pain Management: Goal: Pain level will decrease Outcome: Progressing   Problem: Skin Integrity: Goal: Will show signs of wound healing Outcome: Progressing

## 2021-04-05 DIAGNOSIS — E559 Vitamin D deficiency, unspecified: Secondary | ICD-10-CM | POA: Diagnosis not present

## 2021-04-05 DIAGNOSIS — E119 Type 2 diabetes mellitus without complications: Secondary | ICD-10-CM | POA: Diagnosis not present

## 2021-04-06 DIAGNOSIS — D72829 Elevated white blood cell count, unspecified: Secondary | ICD-10-CM | POA: Diagnosis not present

## 2021-04-06 DIAGNOSIS — M5126 Other intervertebral disc displacement, lumbar region: Secondary | ICD-10-CM | POA: Diagnosis not present

## 2021-04-06 DIAGNOSIS — M4326 Fusion of spine, lumbar region: Secondary | ICD-10-CM | POA: Diagnosis not present

## 2021-04-06 DIAGNOSIS — N390 Urinary tract infection, site not specified: Secondary | ICD-10-CM | POA: Diagnosis not present

## 2021-04-09 DIAGNOSIS — D72829 Elevated white blood cell count, unspecified: Secondary | ICD-10-CM | POA: Diagnosis not present

## 2021-04-09 DIAGNOSIS — N390 Urinary tract infection, site not specified: Secondary | ICD-10-CM | POA: Diagnosis not present

## 2021-04-09 DIAGNOSIS — E559 Vitamin D deficiency, unspecified: Secondary | ICD-10-CM | POA: Diagnosis not present

## 2021-04-09 DIAGNOSIS — E119 Type 2 diabetes mellitus without complications: Secondary | ICD-10-CM | POA: Diagnosis not present

## 2021-04-14 DIAGNOSIS — D72829 Elevated white blood cell count, unspecified: Secondary | ICD-10-CM | POA: Diagnosis not present

## 2021-04-14 DIAGNOSIS — N390 Urinary tract infection, site not specified: Secondary | ICD-10-CM | POA: Diagnosis not present

## 2021-04-16 DIAGNOSIS — M5126 Other intervertebral disc displacement, lumbar region: Secondary | ICD-10-CM | POA: Diagnosis not present

## 2021-04-18 DIAGNOSIS — M810 Age-related osteoporosis without current pathological fracture: Secondary | ICD-10-CM | POA: Diagnosis not present

## 2021-04-18 DIAGNOSIS — Z87440 Personal history of urinary (tract) infections: Secondary | ICD-10-CM | POA: Diagnosis not present

## 2021-04-18 DIAGNOSIS — E7800 Pure hypercholesterolemia, unspecified: Secondary | ICD-10-CM | POA: Diagnosis not present

## 2021-04-18 DIAGNOSIS — Z4789 Encounter for other orthopedic aftercare: Secondary | ICD-10-CM | POA: Diagnosis not present

## 2021-04-18 DIAGNOSIS — I8390 Asymptomatic varicose veins of unspecified lower extremity: Secondary | ICD-10-CM | POA: Diagnosis not present

## 2021-04-18 DIAGNOSIS — Z85828 Personal history of other malignant neoplasm of skin: Secondary | ICD-10-CM | POA: Diagnosis not present

## 2021-04-18 DIAGNOSIS — F32A Depression, unspecified: Secondary | ICD-10-CM | POA: Diagnosis not present

## 2021-04-18 DIAGNOSIS — Z9181 History of falling: Secondary | ICD-10-CM | POA: Diagnosis not present

## 2021-04-18 DIAGNOSIS — M5116 Intervertebral disc disorders with radiculopathy, lumbar region: Secondary | ICD-10-CM | POA: Diagnosis not present

## 2021-04-18 DIAGNOSIS — I10 Essential (primary) hypertension: Secondary | ICD-10-CM | POA: Diagnosis not present

## 2021-04-18 DIAGNOSIS — D72829 Elevated white blood cell count, unspecified: Secondary | ICD-10-CM | POA: Diagnosis not present

## 2021-04-18 DIAGNOSIS — E559 Vitamin D deficiency, unspecified: Secondary | ICD-10-CM | POA: Diagnosis not present

## 2021-04-21 DIAGNOSIS — E559 Vitamin D deficiency, unspecified: Secondary | ICD-10-CM | POA: Diagnosis not present

## 2021-04-21 DIAGNOSIS — Z4789 Encounter for other orthopedic aftercare: Secondary | ICD-10-CM | POA: Diagnosis not present

## 2021-04-21 DIAGNOSIS — I10 Essential (primary) hypertension: Secondary | ICD-10-CM | POA: Diagnosis not present

## 2021-04-21 DIAGNOSIS — Z87440 Personal history of urinary (tract) infections: Secondary | ICD-10-CM | POA: Diagnosis not present

## 2021-04-21 DIAGNOSIS — D72829 Elevated white blood cell count, unspecified: Secondary | ICD-10-CM | POA: Diagnosis not present

## 2021-04-21 DIAGNOSIS — M5116 Intervertebral disc disorders with radiculopathy, lumbar region: Secondary | ICD-10-CM | POA: Diagnosis not present

## 2021-04-21 DIAGNOSIS — I8390 Asymptomatic varicose veins of unspecified lower extremity: Secondary | ICD-10-CM | POA: Diagnosis not present

## 2021-04-21 DIAGNOSIS — M810 Age-related osteoporosis without current pathological fracture: Secondary | ICD-10-CM | POA: Diagnosis not present

## 2021-04-21 DIAGNOSIS — Z85828 Personal history of other malignant neoplasm of skin: Secondary | ICD-10-CM | POA: Diagnosis not present

## 2021-04-21 DIAGNOSIS — Z9181 History of falling: Secondary | ICD-10-CM | POA: Diagnosis not present

## 2021-04-21 DIAGNOSIS — F32A Depression, unspecified: Secondary | ICD-10-CM | POA: Diagnosis not present

## 2021-04-21 DIAGNOSIS — E7800 Pure hypercholesterolemia, unspecified: Secondary | ICD-10-CM | POA: Diagnosis not present

## 2021-04-22 DIAGNOSIS — E785 Hyperlipidemia, unspecified: Secondary | ICD-10-CM | POA: Diagnosis not present

## 2021-04-22 DIAGNOSIS — Z9889 Other specified postprocedural states: Secondary | ICD-10-CM | POA: Diagnosis not present

## 2021-04-22 DIAGNOSIS — M5136 Other intervertebral disc degeneration, lumbar region: Secondary | ICD-10-CM | POA: Diagnosis not present

## 2021-04-23 DIAGNOSIS — Z85828 Personal history of other malignant neoplasm of skin: Secondary | ICD-10-CM | POA: Diagnosis not present

## 2021-04-23 DIAGNOSIS — M6281 Muscle weakness (generalized): Secondary | ICD-10-CM | POA: Diagnosis not present

## 2021-04-23 DIAGNOSIS — Z87440 Personal history of urinary (tract) infections: Secondary | ICD-10-CM | POA: Diagnosis not present

## 2021-04-23 DIAGNOSIS — Z9181 History of falling: Secondary | ICD-10-CM | POA: Diagnosis not present

## 2021-04-23 DIAGNOSIS — M5116 Intervertebral disc disorders with radiculopathy, lumbar region: Secondary | ICD-10-CM | POA: Diagnosis not present

## 2021-04-23 DIAGNOSIS — I10 Essential (primary) hypertension: Secondary | ICD-10-CM | POA: Diagnosis not present

## 2021-04-23 DIAGNOSIS — F32A Depression, unspecified: Secondary | ICD-10-CM | POA: Diagnosis not present

## 2021-04-23 DIAGNOSIS — E7800 Pure hypercholesterolemia, unspecified: Secondary | ICD-10-CM | POA: Diagnosis not present

## 2021-04-23 DIAGNOSIS — Z4789 Encounter for other orthopedic aftercare: Secondary | ICD-10-CM | POA: Diagnosis not present

## 2021-04-23 DIAGNOSIS — M810 Age-related osteoporosis without current pathological fracture: Secondary | ICD-10-CM | POA: Diagnosis not present

## 2021-04-23 DIAGNOSIS — D72829 Elevated white blood cell count, unspecified: Secondary | ICD-10-CM | POA: Diagnosis not present

## 2021-04-23 DIAGNOSIS — E559 Vitamin D deficiency, unspecified: Secondary | ICD-10-CM | POA: Diagnosis not present

## 2021-04-23 DIAGNOSIS — I8390 Asymptomatic varicose veins of unspecified lower extremity: Secondary | ICD-10-CM | POA: Diagnosis not present

## 2021-04-24 DIAGNOSIS — Z87440 Personal history of urinary (tract) infections: Secondary | ICD-10-CM | POA: Diagnosis not present

## 2021-04-24 DIAGNOSIS — E559 Vitamin D deficiency, unspecified: Secondary | ICD-10-CM | POA: Diagnosis not present

## 2021-04-24 DIAGNOSIS — I10 Essential (primary) hypertension: Secondary | ICD-10-CM | POA: Diagnosis not present

## 2021-04-24 DIAGNOSIS — Z9181 History of falling: Secondary | ICD-10-CM | POA: Diagnosis not present

## 2021-04-24 DIAGNOSIS — Z4789 Encounter for other orthopedic aftercare: Secondary | ICD-10-CM | POA: Diagnosis not present

## 2021-04-24 DIAGNOSIS — I8390 Asymptomatic varicose veins of unspecified lower extremity: Secondary | ICD-10-CM | POA: Diagnosis not present

## 2021-04-24 DIAGNOSIS — D72829 Elevated white blood cell count, unspecified: Secondary | ICD-10-CM | POA: Diagnosis not present

## 2021-04-24 DIAGNOSIS — M5116 Intervertebral disc disorders with radiculopathy, lumbar region: Secondary | ICD-10-CM | POA: Diagnosis not present

## 2021-04-24 DIAGNOSIS — M810 Age-related osteoporosis without current pathological fracture: Secondary | ICD-10-CM | POA: Diagnosis not present

## 2021-04-24 DIAGNOSIS — F32A Depression, unspecified: Secondary | ICD-10-CM | POA: Diagnosis not present

## 2021-04-24 DIAGNOSIS — Z85828 Personal history of other malignant neoplasm of skin: Secondary | ICD-10-CM | POA: Diagnosis not present

## 2021-04-24 DIAGNOSIS — E7800 Pure hypercholesterolemia, unspecified: Secondary | ICD-10-CM | POA: Diagnosis not present

## 2021-04-25 DIAGNOSIS — E559 Vitamin D deficiency, unspecified: Secondary | ICD-10-CM | POA: Diagnosis not present

## 2021-04-25 DIAGNOSIS — Z85828 Personal history of other malignant neoplasm of skin: Secondary | ICD-10-CM | POA: Diagnosis not present

## 2021-04-25 DIAGNOSIS — F32A Depression, unspecified: Secondary | ICD-10-CM | POA: Diagnosis not present

## 2021-04-25 DIAGNOSIS — I8390 Asymptomatic varicose veins of unspecified lower extremity: Secondary | ICD-10-CM | POA: Diagnosis not present

## 2021-04-25 DIAGNOSIS — Z87440 Personal history of urinary (tract) infections: Secondary | ICD-10-CM | POA: Diagnosis not present

## 2021-04-25 DIAGNOSIS — D72829 Elevated white blood cell count, unspecified: Secondary | ICD-10-CM | POA: Diagnosis not present

## 2021-04-25 DIAGNOSIS — M810 Age-related osteoporosis without current pathological fracture: Secondary | ICD-10-CM | POA: Diagnosis not present

## 2021-04-25 DIAGNOSIS — M5116 Intervertebral disc disorders with radiculopathy, lumbar region: Secondary | ICD-10-CM | POA: Diagnosis not present

## 2021-04-25 DIAGNOSIS — E7800 Pure hypercholesterolemia, unspecified: Secondary | ICD-10-CM | POA: Diagnosis not present

## 2021-04-25 DIAGNOSIS — Z9181 History of falling: Secondary | ICD-10-CM | POA: Diagnosis not present

## 2021-04-25 DIAGNOSIS — I10 Essential (primary) hypertension: Secondary | ICD-10-CM | POA: Diagnosis not present

## 2021-04-25 DIAGNOSIS — Z4789 Encounter for other orthopedic aftercare: Secondary | ICD-10-CM | POA: Diagnosis not present

## 2021-04-30 DIAGNOSIS — E559 Vitamin D deficiency, unspecified: Secondary | ICD-10-CM | POA: Diagnosis not present

## 2021-04-30 DIAGNOSIS — Z85828 Personal history of other malignant neoplasm of skin: Secondary | ICD-10-CM | POA: Diagnosis not present

## 2021-04-30 DIAGNOSIS — I10 Essential (primary) hypertension: Secondary | ICD-10-CM | POA: Diagnosis not present

## 2021-04-30 DIAGNOSIS — Z87440 Personal history of urinary (tract) infections: Secondary | ICD-10-CM | POA: Diagnosis not present

## 2021-04-30 DIAGNOSIS — I8390 Asymptomatic varicose veins of unspecified lower extremity: Secondary | ICD-10-CM | POA: Diagnosis not present

## 2021-04-30 DIAGNOSIS — Z4789 Encounter for other orthopedic aftercare: Secondary | ICD-10-CM | POA: Diagnosis not present

## 2021-04-30 DIAGNOSIS — D72829 Elevated white blood cell count, unspecified: Secondary | ICD-10-CM | POA: Diagnosis not present

## 2021-04-30 DIAGNOSIS — F32A Depression, unspecified: Secondary | ICD-10-CM | POA: Diagnosis not present

## 2021-04-30 DIAGNOSIS — M810 Age-related osteoporosis without current pathological fracture: Secondary | ICD-10-CM | POA: Diagnosis not present

## 2021-04-30 DIAGNOSIS — Z9181 History of falling: Secondary | ICD-10-CM | POA: Diagnosis not present

## 2021-04-30 DIAGNOSIS — E7800 Pure hypercholesterolemia, unspecified: Secondary | ICD-10-CM | POA: Diagnosis not present

## 2021-04-30 DIAGNOSIS — M5116 Intervertebral disc disorders with radiculopathy, lumbar region: Secondary | ICD-10-CM | POA: Diagnosis not present

## 2021-05-02 DIAGNOSIS — E7800 Pure hypercholesterolemia, unspecified: Secondary | ICD-10-CM | POA: Diagnosis not present

## 2021-05-02 DIAGNOSIS — Z4789 Encounter for other orthopedic aftercare: Secondary | ICD-10-CM | POA: Diagnosis not present

## 2021-05-02 DIAGNOSIS — D72829 Elevated white blood cell count, unspecified: Secondary | ICD-10-CM | POA: Diagnosis not present

## 2021-05-02 DIAGNOSIS — M5116 Intervertebral disc disorders with radiculopathy, lumbar region: Secondary | ICD-10-CM | POA: Diagnosis not present

## 2021-05-02 DIAGNOSIS — Z87440 Personal history of urinary (tract) infections: Secondary | ICD-10-CM | POA: Diagnosis not present

## 2021-05-02 DIAGNOSIS — M810 Age-related osteoporosis without current pathological fracture: Secondary | ICD-10-CM | POA: Diagnosis not present

## 2021-05-02 DIAGNOSIS — Z9181 History of falling: Secondary | ICD-10-CM | POA: Diagnosis not present

## 2021-05-02 DIAGNOSIS — I10 Essential (primary) hypertension: Secondary | ICD-10-CM | POA: Diagnosis not present

## 2021-05-02 DIAGNOSIS — E559 Vitamin D deficiency, unspecified: Secondary | ICD-10-CM | POA: Diagnosis not present

## 2021-05-02 DIAGNOSIS — I8390 Asymptomatic varicose veins of unspecified lower extremity: Secondary | ICD-10-CM | POA: Diagnosis not present

## 2021-05-02 DIAGNOSIS — Z85828 Personal history of other malignant neoplasm of skin: Secondary | ICD-10-CM | POA: Diagnosis not present

## 2021-05-02 DIAGNOSIS — F32A Depression, unspecified: Secondary | ICD-10-CM | POA: Diagnosis not present

## 2021-05-06 DIAGNOSIS — Z4789 Encounter for other orthopedic aftercare: Secondary | ICD-10-CM | POA: Diagnosis not present

## 2021-05-06 DIAGNOSIS — E7800 Pure hypercholesterolemia, unspecified: Secondary | ICD-10-CM | POA: Diagnosis not present

## 2021-05-06 DIAGNOSIS — E559 Vitamin D deficiency, unspecified: Secondary | ICD-10-CM | POA: Diagnosis not present

## 2021-05-06 DIAGNOSIS — D72829 Elevated white blood cell count, unspecified: Secondary | ICD-10-CM | POA: Diagnosis not present

## 2021-05-06 DIAGNOSIS — I8390 Asymptomatic varicose veins of unspecified lower extremity: Secondary | ICD-10-CM | POA: Diagnosis not present

## 2021-05-06 DIAGNOSIS — Z9181 History of falling: Secondary | ICD-10-CM | POA: Diagnosis not present

## 2021-05-06 DIAGNOSIS — F32A Depression, unspecified: Secondary | ICD-10-CM | POA: Diagnosis not present

## 2021-05-06 DIAGNOSIS — Z87440 Personal history of urinary (tract) infections: Secondary | ICD-10-CM | POA: Diagnosis not present

## 2021-05-06 DIAGNOSIS — Z85828 Personal history of other malignant neoplasm of skin: Secondary | ICD-10-CM | POA: Diagnosis not present

## 2021-05-06 DIAGNOSIS — M810 Age-related osteoporosis without current pathological fracture: Secondary | ICD-10-CM | POA: Diagnosis not present

## 2021-05-06 DIAGNOSIS — I10 Essential (primary) hypertension: Secondary | ICD-10-CM | POA: Diagnosis not present

## 2021-05-06 DIAGNOSIS — M5116 Intervertebral disc disorders with radiculopathy, lumbar region: Secondary | ICD-10-CM | POA: Diagnosis not present

## 2021-05-15 DIAGNOSIS — E7800 Pure hypercholesterolemia, unspecified: Secondary | ICD-10-CM | POA: Diagnosis not present

## 2021-05-15 DIAGNOSIS — F32A Depression, unspecified: Secondary | ICD-10-CM | POA: Diagnosis not present

## 2021-05-15 DIAGNOSIS — Z85828 Personal history of other malignant neoplasm of skin: Secondary | ICD-10-CM | POA: Diagnosis not present

## 2021-05-15 DIAGNOSIS — M5116 Intervertebral disc disorders with radiculopathy, lumbar region: Secondary | ICD-10-CM | POA: Diagnosis not present

## 2021-05-15 DIAGNOSIS — E559 Vitamin D deficiency, unspecified: Secondary | ICD-10-CM | POA: Diagnosis not present

## 2021-05-15 DIAGNOSIS — M810 Age-related osteoporosis without current pathological fracture: Secondary | ICD-10-CM | POA: Diagnosis not present

## 2021-05-15 DIAGNOSIS — D72829 Elevated white blood cell count, unspecified: Secondary | ICD-10-CM | POA: Diagnosis not present

## 2021-05-15 DIAGNOSIS — I10 Essential (primary) hypertension: Secondary | ICD-10-CM | POA: Diagnosis not present

## 2021-05-15 DIAGNOSIS — I8390 Asymptomatic varicose veins of unspecified lower extremity: Secondary | ICD-10-CM | POA: Diagnosis not present

## 2021-05-15 DIAGNOSIS — Z9181 History of falling: Secondary | ICD-10-CM | POA: Diagnosis not present

## 2021-05-15 DIAGNOSIS — Z87440 Personal history of urinary (tract) infections: Secondary | ICD-10-CM | POA: Diagnosis not present

## 2021-05-15 DIAGNOSIS — Z4789 Encounter for other orthopedic aftercare: Secondary | ICD-10-CM | POA: Diagnosis not present

## 2021-06-04 DIAGNOSIS — M5126 Other intervertebral disc displacement, lumbar region: Secondary | ICD-10-CM | POA: Diagnosis not present

## 2021-06-16 DIAGNOSIS — H0288A Meibomian gland dysfunction right eye, upper and lower eyelids: Secondary | ICD-10-CM | POA: Diagnosis not present

## 2021-06-16 DIAGNOSIS — H16223 Keratoconjunctivitis sicca, not specified as Sjogren's, bilateral: Secondary | ICD-10-CM | POA: Diagnosis not present

## 2021-06-16 DIAGNOSIS — H40013 Open angle with borderline findings, low risk, bilateral: Secondary | ICD-10-CM | POA: Diagnosis not present

## 2021-06-16 DIAGNOSIS — H0288B Meibomian gland dysfunction left eye, upper and lower eyelids: Secondary | ICD-10-CM | POA: Diagnosis not present

## 2021-06-16 DIAGNOSIS — Z961 Presence of intraocular lens: Secondary | ICD-10-CM | POA: Diagnosis not present

## 2021-06-17 ENCOUNTER — Other Ambulatory Visit: Payer: Self-pay | Admitting: Family Medicine

## 2021-06-17 DIAGNOSIS — Z1231 Encounter for screening mammogram for malignant neoplasm of breast: Secondary | ICD-10-CM

## 2021-06-20 ENCOUNTER — Ambulatory Visit
Admission: RE | Admit: 2021-06-20 | Discharge: 2021-06-20 | Disposition: A | Discharge status: Home / Self Care | Payer: Medicare Other | Source: Ambulatory Visit | Attending: Family Medicine | Admitting: Family Medicine

## 2021-06-20 ENCOUNTER — Other Ambulatory Visit: Payer: Self-pay

## 2021-06-20 DIAGNOSIS — Z1231 Encounter for screening mammogram for malignant neoplasm of breast: Secondary | ICD-10-CM

## 2021-06-23 DIAGNOSIS — E559 Vitamin D deficiency, unspecified: Secondary | ICD-10-CM | POA: Diagnosis not present

## 2021-06-23 DIAGNOSIS — M810 Age-related osteoporosis without current pathological fracture: Secondary | ICD-10-CM | POA: Diagnosis not present

## 2021-06-23 DIAGNOSIS — Z0000 Encounter for general adult medical examination without abnormal findings: Secondary | ICD-10-CM | POA: Diagnosis not present

## 2021-06-23 DIAGNOSIS — E785 Hyperlipidemia, unspecified: Secondary | ICD-10-CM | POA: Diagnosis not present

## 2021-07-15 DIAGNOSIS — M810 Age-related osteoporosis without current pathological fracture: Secondary | ICD-10-CM | POA: Diagnosis not present

## 2021-09-30 DIAGNOSIS — L218 Other seborrheic dermatitis: Secondary | ICD-10-CM | POA: Diagnosis not present

## 2021-11-11 DIAGNOSIS — L218 Other seborrheic dermatitis: Secondary | ICD-10-CM | POA: Diagnosis not present

## 2021-11-11 DIAGNOSIS — L578 Other skin changes due to chronic exposure to nonionizing radiation: Secondary | ICD-10-CM | POA: Diagnosis not present

## 2021-11-11 DIAGNOSIS — L680 Hirsutism: Secondary | ICD-10-CM | POA: Diagnosis not present

## 2022-01-06 DIAGNOSIS — L218 Other seborrheic dermatitis: Secondary | ICD-10-CM | POA: Diagnosis not present

## 2022-01-06 DIAGNOSIS — L820 Inflamed seborrheic keratosis: Secondary | ICD-10-CM | POA: Diagnosis not present

## 2022-01-19 DIAGNOSIS — M810 Age-related osteoporosis without current pathological fracture: Secondary | ICD-10-CM | POA: Diagnosis not present

## 2022-03-10 DIAGNOSIS — L218 Other seborrheic dermatitis: Secondary | ICD-10-CM | POA: Diagnosis not present

## 2022-03-10 DIAGNOSIS — L570 Actinic keratosis: Secondary | ICD-10-CM | POA: Diagnosis not present

## 2022-06-22 DIAGNOSIS — H0288B Meibomian gland dysfunction left eye, upper and lower eyelids: Secondary | ICD-10-CM | POA: Diagnosis not present

## 2022-06-22 DIAGNOSIS — H0288A Meibomian gland dysfunction right eye, upper and lower eyelids: Secondary | ICD-10-CM | POA: Diagnosis not present

## 2022-06-22 DIAGNOSIS — Z961 Presence of intraocular lens: Secondary | ICD-10-CM | POA: Diagnosis not present

## 2022-07-03 ENCOUNTER — Other Ambulatory Visit: Payer: Self-pay | Admitting: Family Medicine

## 2022-07-03 DIAGNOSIS — Z1231 Encounter for screening mammogram for malignant neoplasm of breast: Secondary | ICD-10-CM

## 2022-07-07 DIAGNOSIS — Z0000 Encounter for general adult medical examination without abnormal findings: Secondary | ICD-10-CM | POA: Diagnosis not present

## 2022-07-07 DIAGNOSIS — E559 Vitamin D deficiency, unspecified: Secondary | ICD-10-CM | POA: Diagnosis not present

## 2022-07-07 DIAGNOSIS — M5136 Other intervertebral disc degeneration, lumbar region: Secondary | ICD-10-CM | POA: Diagnosis not present

## 2022-07-07 DIAGNOSIS — E785 Hyperlipidemia, unspecified: Secondary | ICD-10-CM | POA: Diagnosis not present

## 2022-07-07 DIAGNOSIS — J309 Allergic rhinitis, unspecified: Secondary | ICD-10-CM | POA: Diagnosis not present

## 2022-07-07 DIAGNOSIS — F324 Major depressive disorder, single episode, in partial remission: Secondary | ICD-10-CM | POA: Diagnosis not present

## 2022-07-07 DIAGNOSIS — R609 Edema, unspecified: Secondary | ICD-10-CM | POA: Diagnosis not present

## 2022-07-07 DIAGNOSIS — M810 Age-related osteoporosis without current pathological fracture: Secondary | ICD-10-CM | POA: Diagnosis not present

## 2022-07-08 ENCOUNTER — Other Ambulatory Visit: Payer: Self-pay | Admitting: Family Medicine

## 2022-07-08 DIAGNOSIS — M810 Age-related osteoporosis without current pathological fracture: Secondary | ICD-10-CM

## 2022-08-11 DIAGNOSIS — M810 Age-related osteoporosis without current pathological fracture: Secondary | ICD-10-CM | POA: Diagnosis not present

## 2022-08-25 ENCOUNTER — Ambulatory Visit
Admission: RE | Admit: 2022-08-25 | Discharge: 2022-08-25 | Disposition: A | Discharge status: Home / Self Care | Payer: Medicare HMO | Source: Ambulatory Visit | Attending: Family Medicine | Admitting: Family Medicine

## 2022-08-25 DIAGNOSIS — Z1231 Encounter for screening mammogram for malignant neoplasm of breast: Secondary | ICD-10-CM | POA: Diagnosis not present

## 2022-11-13 IMAGING — MR MR LUMBAR SPINE W/O CM
4 of 5 series · 27 of 48 positions shown · non-contrast
Comparison: MRI of the lumbar spine November 03, 2019.

CLINICAL DATA: Low back pain.

EXAM:
MRI LUMBAR SPINE WITHOUT CONTRAST
TECHNIQUE: Multiplanar, multisequence MR imaging of the lumbar spine was
performed. No intravenous contrast was administered.

[Series 3: T2 · sagittal · 4.0mm · 1.09mm/px · 7 of 17 slices shown (1 of 2)]
[im 1/17]
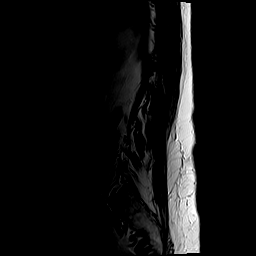
[im 3/17]
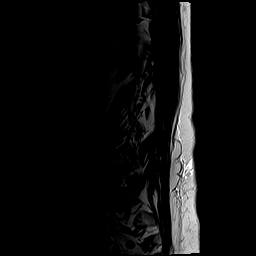
[im 6/17]
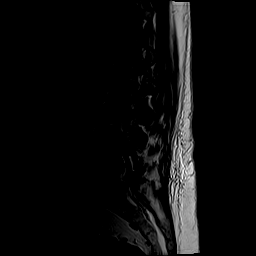
[im 9/17]
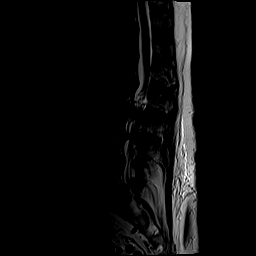
[im 11/17]
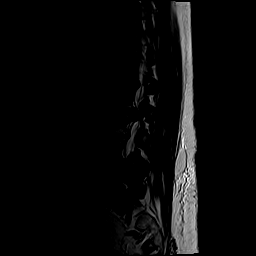
[im 14/17]
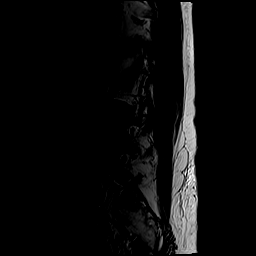
[im 17/17]
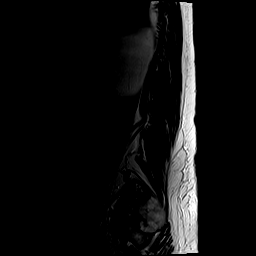

[Series 5: T1 · sagittal · 4.0mm · 1.09mm/px · 6 of 17 slices shown (1 of 2)]
[im 1/17]
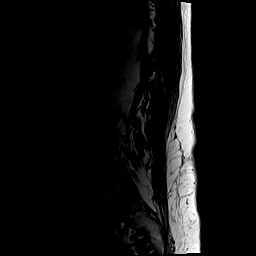
[im 4/17]
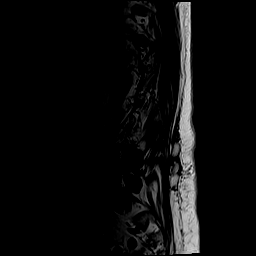
[im 7/17]
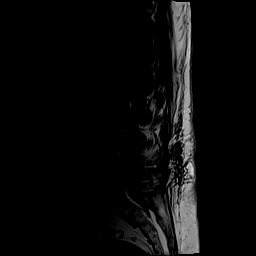
[im 10/17]
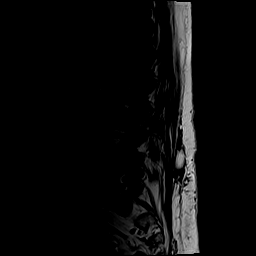
[im 13/17]
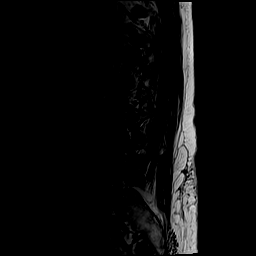
[im 17/17]
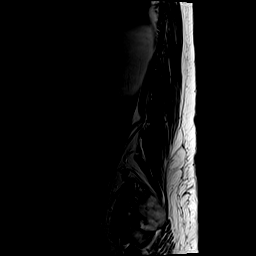

[Series 6: T2 · axial · 4.0mm · 0.39mm/px · z∈[-93,+101]mm · 8 of 38 slices shown (2 of 2)]
[im 1/38]
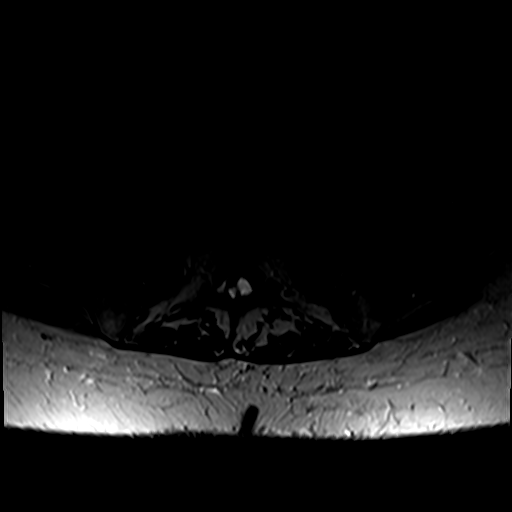
[im 6/38]
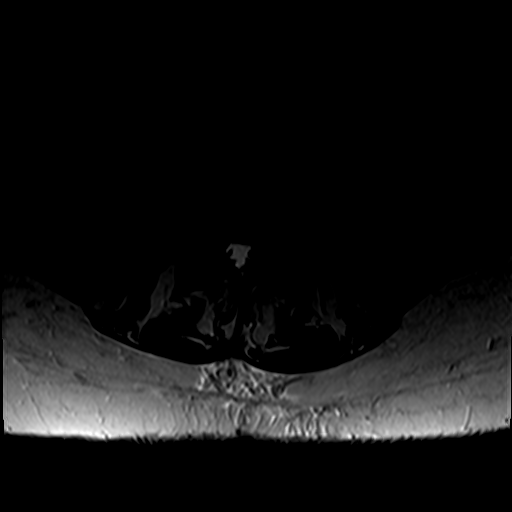
[im 12/38]
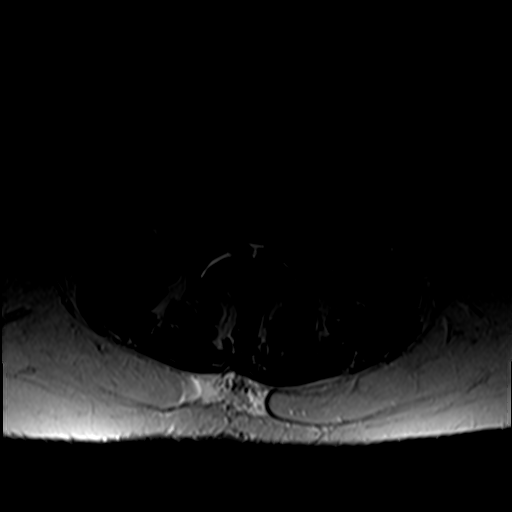
[im 18/38]
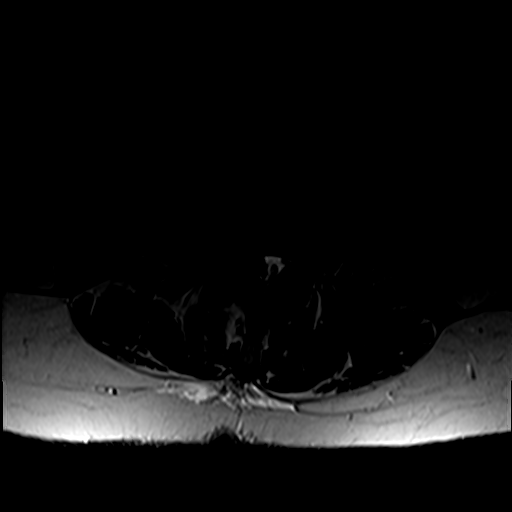
[im 20/38]
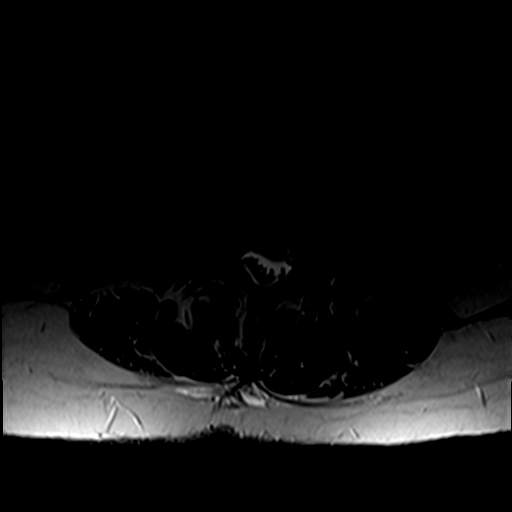
[im 26/38]
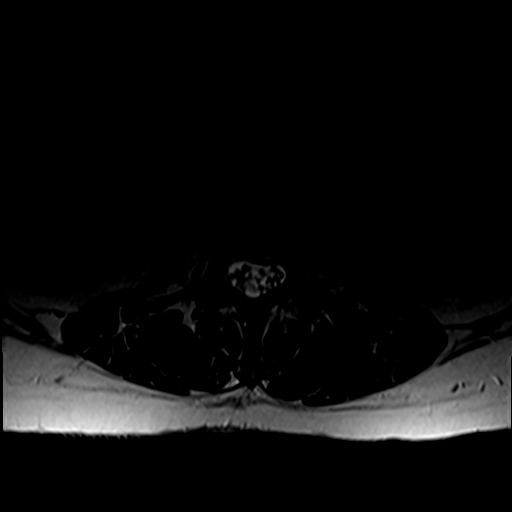
[im 32/38]
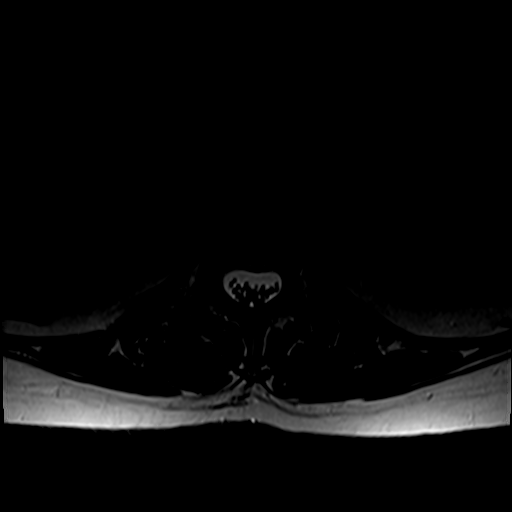
[im 38/38]
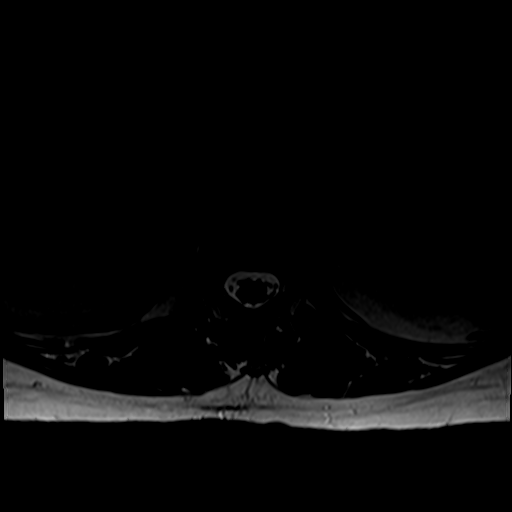

[Series 7: T1 · axial · 4.0mm · 0.39mm/px · z∈[-93,+72]mm · 6 of 38 slices shown (2 of 2)]
[im 1/38]
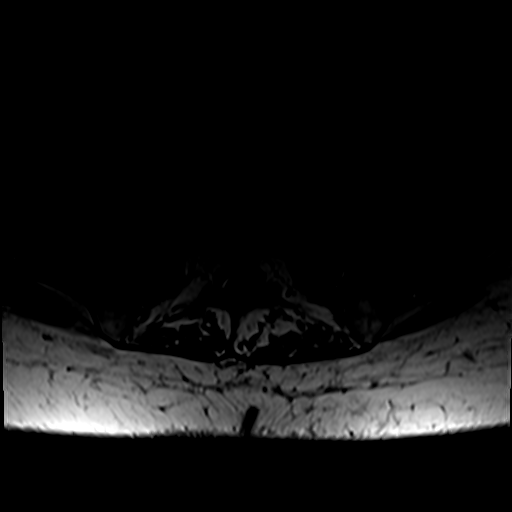
[im 6/38]
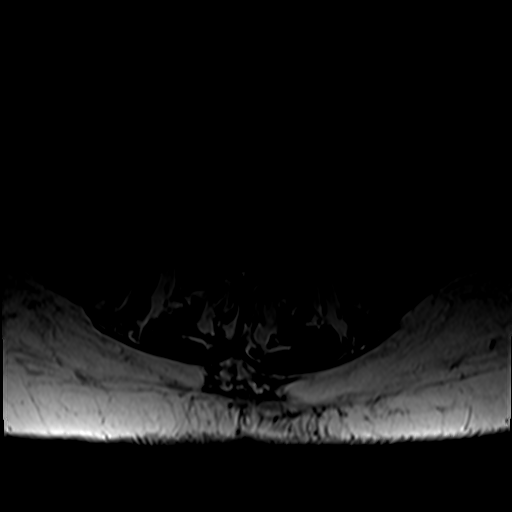
[im 12/38]
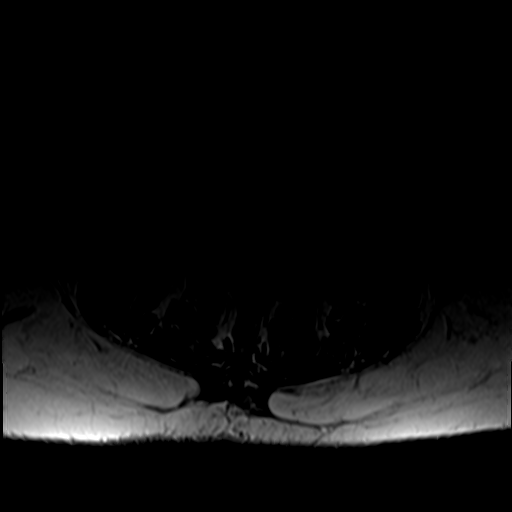
[im 18/38]
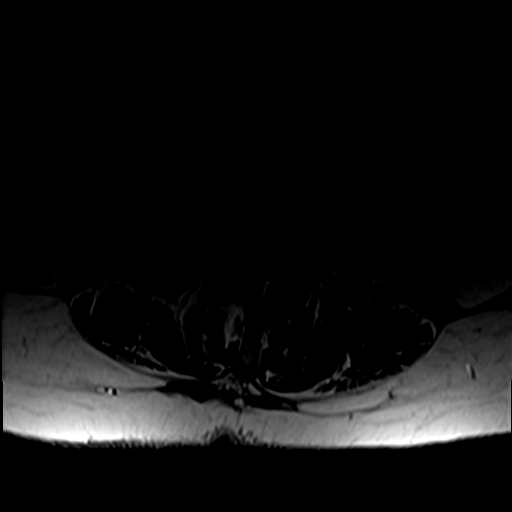
[im 20/38]
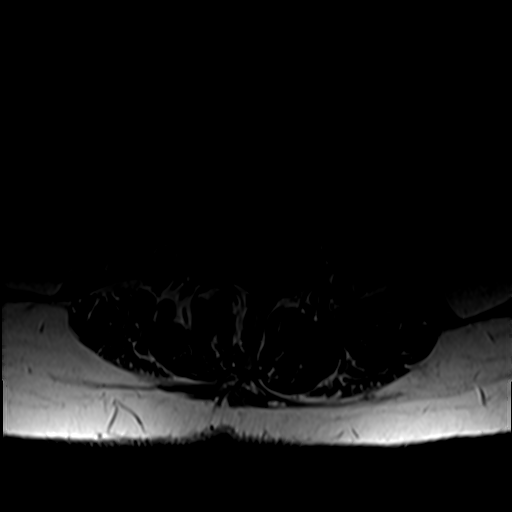
[im 32/38]
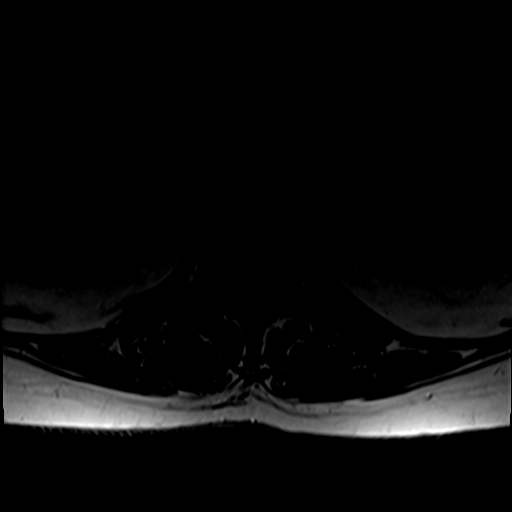

[27 of 48 positions shown; findings below may reference images not displayed]

FINDINGS: Segmentation:  Standard.

Alignment:  Levoconvex scoliosis of the lumbar spine

Vertebrae: No fracture, evidence of discitis, or bone lesion.
Endplate degenerative changes from L2-3 through L5-S1.

Conus medullaris and cauda equina: Conus extends to the L1 level.
Conus appear normal. Redundancy of the roots of the cauda equina at
the L2 level related to spinal canal stenosis.

Paraspinal and other soft tissues: Colonic diverticulosis.

Disc levels:

She T12-L1: No spinal canal or neural foraminal stenosis.

L1-2: Disc bulge and mild facet degenerative changes without
significant spinal canal or neural foraminal stenosis.

L2-3: Loss of disc height, disc bulge with superimposed large left
central disc extrusion migrating inferiorly, facet degenerative
changes and ligamentum flavum redundancy. Findings result in severe
spinal canal stenosis which is progressed from prior MRI. Mild
bilateral neural foraminal narrowing.

L3-4: Loss of disc height, right asymmetric disc bulge, moderate
facet degenerative changes and ligamentum flavum redundancy
resulting in moderate spinal canal stenosis with narrowing of the
bilateral subarticular zones, right greater than left, moderate
right and mild left neural foraminal narrowing. No significant
change from prior MRI.

L4-5: Disc bulge, facet degenerative changes with bilateral joint
effusion and ligamentum flavum redundancy resulting in moderate
spinal canal stenosis with narrowing of the bilateral subarticular
zones, moderate right and mild left neural foraminal narrowing. No
significant change from prior.

L5-S1: Disc bulge, facet degenerative changes, left greater than
right, and ligamentum flavum redundancy resulting in mild narrowing
of the bilateral subarticular zones, left greater than right and
mild bilateral neural foraminal narrowing.
IMPRESSION: 1. Multilevel degenerative changes of the lumbar spine with severe
spinal canal stenosis at L2-L3, progressed from prior MRI.
2. Moderate spinal canal stenosis at L3-L4 and L4-L5, unchanged.
3. Moderate right neural foraminal narrowing at L3-L4 and L4-L5.

## 2022-12-02 ENCOUNTER — Ambulatory Visit
Admission: RE | Admit: 2022-12-02 | Discharge: 2022-12-02 | Disposition: A | Discharge status: Home / Self Care | Payer: Medicare HMO | Source: Ambulatory Visit | Attending: Family Medicine | Admitting: Family Medicine

## 2022-12-02 DIAGNOSIS — E349 Endocrine disorder, unspecified: Secondary | ICD-10-CM | POA: Diagnosis not present

## 2022-12-02 DIAGNOSIS — M8588 Other specified disorders of bone density and structure, other site: Secondary | ICD-10-CM | POA: Diagnosis not present

## 2022-12-02 DIAGNOSIS — M810 Age-related osteoporosis without current pathological fracture: Secondary | ICD-10-CM

## 2023-02-10 DIAGNOSIS — M810 Age-related osteoporosis without current pathological fracture: Secondary | ICD-10-CM | POA: Diagnosis not present

## 2023-06-30 DIAGNOSIS — Z961 Presence of intraocular lens: Secondary | ICD-10-CM | POA: Diagnosis not present

## 2023-06-30 DIAGNOSIS — H0288A Meibomian gland dysfunction right eye, upper and lower eyelids: Secondary | ICD-10-CM | POA: Diagnosis not present

## 2023-06-30 DIAGNOSIS — H0288B Meibomian gland dysfunction left eye, upper and lower eyelids: Secondary | ICD-10-CM | POA: Diagnosis not present

## 2023-06-30 DIAGNOSIS — H40013 Open angle with borderline findings, low risk, bilateral: Secondary | ICD-10-CM | POA: Diagnosis not present

## 2023-07-29 DIAGNOSIS — R030 Elevated blood-pressure reading, without diagnosis of hypertension: Secondary | ICD-10-CM | POA: Diagnosis not present

## 2023-07-29 DIAGNOSIS — Z23 Encounter for immunization: Secondary | ICD-10-CM | POA: Diagnosis not present

## 2023-07-29 DIAGNOSIS — E559 Vitamin D deficiency, unspecified: Secondary | ICD-10-CM | POA: Diagnosis not present

## 2023-07-29 DIAGNOSIS — H04129 Dry eye syndrome of unspecified lacrimal gland: Secondary | ICD-10-CM | POA: Diagnosis not present

## 2023-07-29 DIAGNOSIS — J309 Allergic rhinitis, unspecified: Secondary | ICD-10-CM | POA: Diagnosis not present

## 2023-07-29 DIAGNOSIS — E785 Hyperlipidemia, unspecified: Secondary | ICD-10-CM | POA: Diagnosis not present

## 2023-07-29 DIAGNOSIS — Z0000 Encounter for general adult medical examination without abnormal findings: Secondary | ICD-10-CM | POA: Diagnosis not present

## 2023-07-29 DIAGNOSIS — R609 Edema, unspecified: Secondary | ICD-10-CM | POA: Diagnosis not present

## 2023-07-29 DIAGNOSIS — M810 Age-related osteoporosis without current pathological fracture: Secondary | ICD-10-CM | POA: Diagnosis not present

## 2023-07-29 DIAGNOSIS — B372 Candidiasis of skin and nail: Secondary | ICD-10-CM | POA: Diagnosis not present

## 2023-08-24 ENCOUNTER — Other Ambulatory Visit: Payer: Self-pay | Admitting: Family Medicine

## 2023-08-24 DIAGNOSIS — Z1231 Encounter for screening mammogram for malignant neoplasm of breast: Secondary | ICD-10-CM

## 2023-08-27 DIAGNOSIS — M810 Age-related osteoporosis without current pathological fracture: Secondary | ICD-10-CM | POA: Diagnosis not present

## 2023-09-14 ENCOUNTER — Ambulatory Visit
Admission: RE | Admit: 2023-09-14 | Discharge: 2023-09-14 | Disposition: A | Discharge status: Home / Self Care | Source: Ambulatory Visit | Attending: Family Medicine | Admitting: Family Medicine

## 2023-09-14 DIAGNOSIS — Z1231 Encounter for screening mammogram for malignant neoplasm of breast: Secondary | ICD-10-CM | POA: Diagnosis not present

## 2023-12-07 DIAGNOSIS — R310 Gross hematuria: Secondary | ICD-10-CM | POA: Diagnosis not present

## 2023-12-07 DIAGNOSIS — K921 Melena: Secondary | ICD-10-CM | POA: Diagnosis not present

## 2023-12-08 DIAGNOSIS — K649 Unspecified hemorrhoids: Secondary | ICD-10-CM | POA: Diagnosis not present

## 2023-12-08 DIAGNOSIS — K602 Anal fissure, unspecified: Secondary | ICD-10-CM | POA: Diagnosis not present

## 2024-02-26 ENCOUNTER — Ambulatory Visit: Admission: EM | Admit: 2024-02-26 | Discharge: 2024-02-26 | Disposition: A | Discharge status: Home / Self Care

## 2024-02-26 DIAGNOSIS — N3001 Acute cystitis with hematuria: Secondary | ICD-10-CM

## 2024-02-26 LAB — POCT URINE DIPSTICK
Bilirubin, UA: NEGATIVE
Glucose, UA: NEGATIVE mg/dL
Ketones, POC UA: NEGATIVE mg/dL
Nitrite, UA: NEGATIVE
POC PROTEIN,UA: NEGATIVE
Spec Grav, UA: 1.01 (ref 1.010–1.025)
Urobilinogen, UA: 0.2 U/dL
pH, UA: 6 (ref 5.0–8.0)

## 2024-02-26 MED ORDER — CEPHALEXIN 500 MG PO CAPS
500.0000 mg | ORAL_CAPSULE | Freq: Three times a day (TID) | ORAL | 0 refills | Status: AC
Start: 2024-02-26 — End: 2024-03-04

## 2024-02-26 NOTE — Discharge Instructions (Addendum)
  1. Acute cystitis with hematuria (Primary) - POCT URINE DIPSTICK completed in UC shows large leukocytes, no nitrite, small blood, these findings are possibly indicative of urinary tract infection. - cephALEXin  (KEFLEX ) 500 MG capsule; Take 1 capsule (500 mg total) by mouth 3 (three) times daily for 7 days.  Dispense: 21 capsule; Refill: 0 - Urine Culture collected and sent to lab for further testing results should be available in 2 to 3 days if there is any abnormality patient will be contacted and appropriate treatment provided.

## 2024-02-26 NOTE — ED Provider Notes (Signed)
 [facility]  Note:  This document was prepared using Conservation officer, historic buildings and may include unintentional dictation errors.  MRN: [id number] DOB: 1941-06-12  Subjective:   Amanda Hanna is a 83 y.o. female presenting for abnormal feeling after voiding urine last night.  Patient reports history of UTIs in the past.  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  Patient denies any urinary frequency, urinary urgency, no fever, no hematuria.  Patient would like testing for UTI to make sure she does not have an infection.  No current facility-administered medications for this encounter.  Current Outpatient Medications:    cephALEXin  (KEFLEX ) 500 MG capsule, Take 1 capsule (500 mg total) by mouth 3 (three) times daily for 7 days., Disp: 21 capsule, Rfl: 0   UNABLE TO FIND, Diltiazem 2%/Lidoc 2%oint, Disp: , Rfl:    acetaminophen  (TYLENOL ) 500 MG tablet, Take 1,000 mg by mouth 3 (three) times daily., Disp: , Rfl:    atorvastatin  (LIPITOR) 40 MG tablet, Take 40 mg by mouth daily., Disp: , Rfl:    calcium  carbonate (TUMS - DOSED IN MG ELEMENTAL CALCIUM ) 500 MG chewable tablet, Chew 1 tablet by mouth 2 (two) times daily as needed for indigestion., Disp: , Rfl:    Cholecalciferol (DIALYVITE VITAMIN D 5000) 125 MCG (5000 UT) capsule, Take 5,000 Units by mouth daily., Disp: , Rfl:    ciprofloxacin  (CIPRO ) 500 MG tablet, Take 1 tablet (500 mg total) by mouth 2 (two) times daily., Disp: 20 tablet, Rfl: 0   fluticasone  (FLONASE ) 50 MCG/ACT nasal spray, Place 1 spray into both nostrils 2 (two) times daily as needed for allergies., Disp: , Rfl:    methocarbamol  (ROBAXIN ) 500 MG tablet, Take 1 tablet (500 mg total) by mouth every 6 (six) hours as needed for muscle spasms., Disp: 40 tablet, Rfl: 3   nystatin (MYCOSTATIN/NYSTOP) powder, Apply 1 application topically 2 (two) times daily as needed for rash., Disp: , Rfl:    omeprazole (PRILOSEC) 40 MG  capsule, Take 40 mg by mouth daily as needed (acid reflux)., Disp: , Rfl:    oxyCODONE -acetaminophen  (PERCOCET/ROXICET) 5-325 MG tablet, Take 1-2 tablets by mouth every 4 (four) hours as needed for moderate pain or severe pain., Disp: 30 tablet, Rfl: 0   triamterene -hydrochlorothiazide  (DYAZIDE ) 37.5-25 MG per capsule, Take 1 capsule by mouth daily as needed (swelling)., Disp: , Rfl:    Allergies  Allergen Reactions   Boniva [Ibandronate] Other (See Comments)    Pt took it several years ago and states :It made me very sore all over   Sulfa Antibiotics Swelling    On face    Past Medical History:  Diagnosis Date   Depression    Facial basal cell cancer    Hypercholesteremia    Osteoporosis    Sinusitis    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   Varicose vein      Past Surgical History:  Procedure Laterality Date   basal cell carcinoma from neck     COLONOSCOPY     deviated septum  repair     eyelid surgery     bilateral 2008   osteoporosis     removal squamous cell cancer nose     TRANSFORAMINAL LUMBAR INTERBODY FUSION (TLIF) WITH PEDICLE SCREW FIXATION 1 LEVEL Left 03/27/2021   Procedure: Left Lumbar Two-Three Transforaminal lumbar interbody fusion;  Surgeon: Colon Shove, MD;  Location: [facility] OR;  Service: Neurosurgery;  Laterality: Left;  Left Lumbar Two-Three Transforaminal lumbar interbody fusion   VAGINAL DELIVERY      Family History  Problem Relation Age of Onset   Cancer Father    Cancer Son     Social History   Tobacco Use   Smoking status: Never   Smokeless tobacco: Never  Vaping Use   Vaping status: Never Used  Substance Use Topics   Alcohol use: No   Drug use: No    ROS Refer to HPI for ROS details.  Objective:   Vitals: BP 119/80 (BP Location: Left Wrist)   Pulse 98   Temp 98.3 F (36.8 C) (Oral)   Resp 20   Ht 5' (1.524 m)   Wt 202 lb 2.6 oz (91.7 kg)   LMP  (LMP Unknown)   SpO2 95%   BMI 39.48 kg/m   Physical Exam Vitals and  nursing note reviewed.  Constitutional:      General: She is not in acute distress.    Appearance: She is well-developed. She is not ill-appearing or toxic-appearing.  HENT:     Head: Normocephalic and atraumatic.  Cardiovascular:     Rate and Rhythm: Normal rate.  Pulmonary:     Effort: Pulmonary effort is normal. No respiratory distress.  Abdominal:     Palpations: Abdomen is soft.     Tenderness: There is no abdominal tenderness. There is no right CVA tenderness or left CVA tenderness.  Musculoskeletal:        General: Normal range of motion.  Skin:    General: Skin is warm and dry.  Neurological:     General: No focal deficit present.     Mental Status: She is alert and oriented to person, place, and time.  Psychiatric:        Mood and Affect: Mood normal.        Behavior: Behavior normal.     Procedures  Results for orders placed or performed during the hospital encounter of 02/26/24 (from the past 24 hours)  POCT URINE DIPSTICK     Status: Abnormal   Collection Time: 02/26/24  1:52 PM  Result Value Ref Range   Color, UA light yellow (A) yellow   Clarity, UA cloudy (A) clear   Glucose, UA negative negative mg/dL   Bilirubin, UA negative negative   Ketones, POC UA negative negative mg/dL   Spec Grav, UA 8.989 8.989 - 1.025   Blood, UA small (A) negative   pH, UA 6.0 5.0 - 8.0   POC PROTEIN,UA negative negative, trace   Urobilinogen, UA 0.2 0.2 or 1.0 E.U./dL   Nitrite, UA Negative Negative   Leukocytes, UA Large (3+) (A) Negative    No results found.   Assessment and Plan :     Discharge Instructions       1. Acute cystitis with hematuria (Primary) - POCT URINE DIPSTICK completed in UC shows large leukocytes, no nitrite, small blood, these findings are possibly indicative of urinary tract infection. - cephALEXin  (KEFLEX ) 500 MG capsule; Take 1 capsule (500 mg total) by mouth 3 (three) times daily for 7 days.  Dispense: 21 capsule; Refill: 0 - Urine  Culture collected and sent to lab for further testing results should be available in 2 to 3 days if there is any abnormality patient will be contacted and appropriate treatment provided.       Amanda Hanna   Amanda Hanna, [place], [place] 02/26/24 1426

## 2024-02-26 NOTE — ED Triage Notes (Signed)
 Patient reports having a funny feeling last night while/after voiding. When having a surgery in the past I got a UTI and had to have a longer stay due to that UTI. Still feeling a discomfort (no pain). No urinary frequency/urgency noted. No fever.

## 2024-02-28 LAB — URINE CULTURE
Culture: >=100000 — AB
Special Requests: NORMAL

## 2024-03-01 ENCOUNTER — Ambulatory Visit (HOSPITAL_COMMUNITY): Payer: Self-pay

## 2024-03-29 DIAGNOSIS — R300 Dysuria: Secondary | ICD-10-CM | POA: Diagnosis not present

## 2024-03-29 DIAGNOSIS — M810 Age-related osteoporosis without current pathological fracture: Secondary | ICD-10-CM | POA: Diagnosis not present
